# Patient Record
Sex: Female | Born: 1990 | Race: White | Hispanic: No | Marital: Married | Smoking: Former smoker
Health system: Southern US, Community
[De-identification: ages and names within clinical notes are randomized; demographics above are authoritative.]

## PROBLEM LIST (undated history)

## (undated) DIAGNOSIS — O139 Gestational [pregnancy-induced] hypertension without significant proteinuria, unspecified trimester: Secondary | ICD-10-CM

## (undated) DIAGNOSIS — D649 Anemia, unspecified: Secondary | ICD-10-CM

## (undated) DIAGNOSIS — J45909 Unspecified asthma, uncomplicated: Secondary | ICD-10-CM

## (undated) HISTORY — DX: Anemia, unspecified: D64.9

## (undated) HISTORY — PX: NO PAST SURGERIES: SHX2092

## (undated) HISTORY — DX: Gestational (pregnancy-induced) hypertension without significant proteinuria, unspecified trimester: O13.9

---
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
## 2011-04-22 ENCOUNTER — Other Ambulatory Visit: Payer: Self-pay | Admitting: Family Medicine

## 2011-04-22 ENCOUNTER — Ambulatory Visit
Admission: RE | Admit: 2011-04-22 | Discharge: 2011-04-22 | Disposition: A | Discharge status: Home / Self Care | Payer: 59 | Source: Ambulatory Visit | Attending: Family Medicine | Admitting: Family Medicine

## 2011-04-22 DIAGNOSIS — R1011 Right upper quadrant pain: Secondary | ICD-10-CM

## 2013-03-08 ENCOUNTER — Emergency Department (HOSPITAL_COMMUNITY)
Admission: EM | Admit: 2013-03-08 | Discharge: 2013-03-08 | Disposition: A | Discharge status: Home / Self Care | Payer: 59 | Attending: Emergency Medicine | Admitting: Emergency Medicine

## 2013-03-08 ENCOUNTER — Encounter (HOSPITAL_COMMUNITY): Payer: Self-pay | Admitting: *Deleted

## 2013-03-08 ENCOUNTER — Emergency Department (HOSPITAL_COMMUNITY): Payer: 59

## 2013-03-08 DIAGNOSIS — K219 Gastro-esophageal reflux disease without esophagitis: Secondary | ICD-10-CM | POA: Insufficient documentation

## 2013-03-08 DIAGNOSIS — R110 Nausea: Secondary | ICD-10-CM | POA: Insufficient documentation

## 2013-03-08 DIAGNOSIS — Z3202 Encounter for pregnancy test, result negative: Secondary | ICD-10-CM | POA: Insufficient documentation

## 2013-03-08 LAB — CBC WITH DIFFERENTIAL/PLATELET
Basophils Relative: 0 % (ref 0–1)
HCT: 37.7 % (ref 36.0–46.0)
Hemoglobin: 13.7 g/dL (ref 12.0–15.0)
Lymphocytes Relative: 44 % (ref 12–46)
Lymphs Abs: 4.1 10*3/uL — ABNORMAL HIGH (ref 0.7–4.0)
Monocytes Absolute: 0.7 10*3/uL (ref 0.1–1.0)
Monocytes Relative: 7 % (ref 3–12)
Neutro Abs: 4.3 10*3/uL (ref 1.7–7.7)
Neutrophils Relative %: 47 % (ref 43–77)
RBC: 4.26 MIL/uL (ref 3.87–5.11)
WBC: 9.3 10*3/uL (ref 4.0–10.5)

## 2013-03-08 LAB — POCT I-STAT, CHEM 8
BUN: 17 mg/dL (ref 6–23)
Creatinine, Ser: 1.1 mg/dL (ref 0.50–1.10)
Potassium: 4 mEq/L (ref 3.5–5.1)
Sodium: 138 mEq/L (ref 135–145)
TCO2: 24 mmol/L (ref 0–100)

## 2013-03-08 LAB — HEPATIC FUNCTION PANEL
ALT: 8 U/L (ref 0–35)
AST: 15 U/L (ref 0–37)
Albumin: 3.7 g/dL (ref 3.5–5.2)
Alkaline Phosphatase: 55 U/L (ref 39–117)
Total Bilirubin: 0.2 mg/dL — ABNORMAL LOW (ref 0.3–1.2)

## 2013-03-08 LAB — URINALYSIS, ROUTINE W REFLEX MICROSCOPIC
Glucose, UA: NEGATIVE mg/dL
Hgb urine dipstick: NEGATIVE
Ketones, ur: NEGATIVE mg/dL
pH: 6 (ref 5.0–8.0)

## 2013-03-08 LAB — URINE MICROSCOPIC-ADD ON

## 2013-03-08 MED ORDER — OMEPRAZOLE 20 MG PO CPDR: 40.0000 mg | DELAYED_RELEASE_CAPSULE | Freq: Every day | ORAL | Status: DC

## 2013-03-08 MED ORDER — GI COCKTAIL ~~LOC~~
30.0000 mL | Freq: Once | ORAL | Status: AC
  Administered 2013-03-08: 30 mL via ORAL
  Filled 2013-03-08: qty 30

## 2013-03-08 MED ORDER — ONDANSETRON HCL 4 MG/2ML IJ SOLN
4.0000 mg | Freq: Once | INTRAMUSCULAR | Status: AC
  Administered 2013-03-08: 4 mg via INTRAVENOUS
  Filled 2013-03-08: qty 2

## 2013-03-08 MED ORDER — MORPHINE SULFATE 4 MG/ML IJ SOLN
4.0000 mg | Freq: Once | INTRAMUSCULAR | Status: AC
  Administered 2013-03-08: 4 mg via INTRAVENOUS
  Filled 2013-03-08: qty 1

## 2013-03-08 MED ORDER — SUCRALFATE 1 GM/10ML PO SUSP: 1.0000 g | Freq: Four times a day (QID) | ORAL | Status: DC

## 2013-03-08 NOTE — ED Notes (Signed)
Pt c/o abd pain x 2 weeks; pt states the pain is to upper abd radiating to the left side; pt states pain is worse after eating and at night; pt states that she has vomited at times

## 2013-03-08 NOTE — ED Provider Notes (Signed)
CSN: [id number]     Arrival date & time 03/08/13  0020 History   First MD Initiated Contact with Patient 03/08/13 0032     Chief Complaint  Patient presents with  . Abdominal Pain   (Consider location/radiation/quality/duration/timing/severity/associated sxs/prior Treatment) Patient is a 22 y.o. female presenting with abdominal pain. The history is provided by the patient. No language interpreter was used.  Abdominal Pain Pain location:  LUQ and epigastric Pain quality: aching   Pain radiates to:  Does not radiate Pain severity:  Severe Onset quality:  Gradual Duration:  2 weeks Timing:  Constant Progression:  Unchanged Chronicity:  New Context: eating   Context: not previous surgeries   Relieved by:  Nothing Worsened by:  Nothing tried Ineffective treatments:  None tried Associated symptoms: nausea   Associated symptoms: no chest pain, no fever and no shortness of breath   Risk factors: not pregnant   Also worse with nausea in the evenings when laying down  History reviewed. No pertinent past medical history. History reviewed. No pertinent past surgical history. No family history on file. History  Substance Use Topics  . Smoking status: Never Smoker   . Smokeless tobacco: Not on file  . Alcohol Use: No   OB History   Grav Para Term Preterm Abortions TAB SAB Ect Mult Living                 Review of Systems  Constitutional: Negative for fever.  Respiratory: Negative for shortness of breath.   Cardiovascular: Negative for chest pain.  Gastrointestinal: Positive for nausea and abdominal pain.  All other systems reviewed and are negative.    Allergies  Review of patient's allergies indicates no known allergies.  Home Medications  No current outpatient prescriptions on file. BP 140/69  Pulse 92  Temp(Src) 98.9 F (37.2 C)  Resp 18  Ht 5\' 5"  (1.651 m)  Wt 183 lb (83.008 kg)  BMI 30.45 kg/m2  SpO2 99%  LMP 02/08/2013 Physical Exam  Constitutional: She is  oriented to person, place, and time. She appears well-developed and well-nourished. No distress.  HENT:  Head: Normocephalic and atraumatic.  Mouth/Throat: Oropharynx is clear and moist.  Eyes: Conjunctivae are normal. Pupils are equal, round, and reactive to light.  Neck: Normal range of motion. Neck supple.  Cardiovascular: Normal rate, regular rhythm and intact distal pulses.   Pulmonary/Chest: Effort normal and breath sounds normal. She has no wheezes. She has no rales.  Abdominal: Soft. Bowel sounds are normal. There is tenderness in the left upper quadrant. There is no rebound and no guarding.  Musculoskeletal: Normal range of motion.  Neurological: She is alert and oriented to person, place, and time.  Skin: Skin is warm and dry.  Psychiatric: She has a normal mood and affect.    ED Course  Procedures (including critical care time) Labs Review Labs Reviewed  CBC WITH DIFFERENTIAL  HEPATIC FUNCTION PANEL  LIPASE, BLOOD  URINALYSIS, ROUTINE W REFLEX MICROSCOPIC   Imaging Review No results found.  MDM  No diagnosis found. Exam and labs consistent with GERD.  No indication for advanced imaging.  Do not feel this is biliary colic, after 2 weeks LFT should be elevated.  Will refer to PMD    Andraya Frigon K Jaece Ducharme-Rasch, MD 03/08/13 [phone]

## 2013-03-09 LAB — URINE CULTURE

## 2013-04-01 NOTE — ED Notes (Signed)
Pt states that she has had diarrhea, nausea and a fever. Pt states that she has not been out of the country. Pt denies vomiting. Pt gave plasma wed and is worried that she may have had these symtpoms from that.

## 2013-04-02 ENCOUNTER — Encounter (HOSPITAL_COMMUNITY): Payer: Self-pay | Admitting: Emergency Medicine

## 2013-04-02 ENCOUNTER — Emergency Department (HOSPITAL_COMMUNITY)
Admission: EM | Admit: 2013-04-02 | Discharge: 2013-04-02 | Disposition: A | Discharge status: Home / Self Care | Payer: 59

## 2013-04-02 NOTE — ED Notes (Signed)
Per registration, wrong patient registered and triaged.

## 2016-08-07 DIAGNOSIS — Z01419 Encounter for gynecological examination (general) (routine) without abnormal findings: Secondary | ICD-10-CM | POA: Diagnosis not present

## 2016-08-07 DIAGNOSIS — Z6832 Body mass index (BMI) 32.0-32.9, adult: Secondary | ICD-10-CM | POA: Diagnosis not present

## 2016-08-28 DIAGNOSIS — Z30430 Encounter for insertion of intrauterine contraceptive device: Secondary | ICD-10-CM | POA: Diagnosis not present

## 2016-09-16 DIAGNOSIS — R1310 Dysphagia, unspecified: Secondary | ICD-10-CM | POA: Diagnosis not present

## 2016-09-16 DIAGNOSIS — R1013 Epigastric pain: Secondary | ICD-10-CM | POA: Diagnosis not present

## 2016-09-16 DIAGNOSIS — R112 Nausea with vomiting, unspecified: Secondary | ICD-10-CM | POA: Diagnosis not present

## 2016-09-16 DIAGNOSIS — K219 Gastro-esophageal reflux disease without esophagitis: Secondary | ICD-10-CM | POA: Diagnosis not present

## 2016-10-09 DIAGNOSIS — Z30431 Encounter for routine checking of intrauterine contraceptive device: Secondary | ICD-10-CM | POA: Diagnosis not present

## 2016-10-14 DIAGNOSIS — K219 Gastro-esophageal reflux disease without esophagitis: Secondary | ICD-10-CM | POA: Diagnosis not present

## 2016-10-14 DIAGNOSIS — R1310 Dysphagia, unspecified: Secondary | ICD-10-CM | POA: Diagnosis not present

## 2016-10-14 DIAGNOSIS — R112 Nausea with vomiting, unspecified: Secondary | ICD-10-CM | POA: Diagnosis not present

## 2016-12-06 DIAGNOSIS — F4321 Adjustment disorder with depressed mood: Secondary | ICD-10-CM | POA: Diagnosis not present

## 2016-12-12 DIAGNOSIS — F4321 Adjustment disorder with depressed mood: Secondary | ICD-10-CM | POA: Diagnosis not present

## 2016-12-19 DIAGNOSIS — F4321 Adjustment disorder with depressed mood: Secondary | ICD-10-CM | POA: Diagnosis not present

## 2016-12-26 DIAGNOSIS — F4321 Adjustment disorder with depressed mood: Secondary | ICD-10-CM | POA: Diagnosis not present

## 2017-01-02 DIAGNOSIS — F4321 Adjustment disorder with depressed mood: Secondary | ICD-10-CM | POA: Diagnosis not present

## 2017-01-09 DIAGNOSIS — F4321 Adjustment disorder with depressed mood: Secondary | ICD-10-CM | POA: Diagnosis not present

## 2017-01-23 DIAGNOSIS — F4321 Adjustment disorder with depressed mood: Secondary | ICD-10-CM | POA: Diagnosis not present

## 2017-01-30 DIAGNOSIS — F4321 Adjustment disorder with depressed mood: Secondary | ICD-10-CM | POA: Diagnosis not present

## 2017-02-06 DIAGNOSIS — F4321 Adjustment disorder with depressed mood: Secondary | ICD-10-CM | POA: Diagnosis not present

## 2017-02-13 DIAGNOSIS — F4321 Adjustment disorder with depressed mood: Secondary | ICD-10-CM | POA: Diagnosis not present

## 2017-03-19 DIAGNOSIS — R05 Cough: Secondary | ICD-10-CM | POA: Diagnosis not present

## 2017-03-19 DIAGNOSIS — J209 Acute bronchitis, unspecified: Secondary | ICD-10-CM | POA: Diagnosis not present

## 2017-08-27 DIAGNOSIS — J101 Influenza due to other identified influenza virus with other respiratory manifestations: Secondary | ICD-10-CM | POA: Diagnosis not present

## 2017-09-02 DIAGNOSIS — B9689 Other specified bacterial agents as the cause of diseases classified elsewhere: Secondary | ICD-10-CM | POA: Diagnosis not present

## 2017-09-02 DIAGNOSIS — J208 Acute bronchitis due to other specified organisms: Secondary | ICD-10-CM | POA: Diagnosis not present

## 2017-11-03 DIAGNOSIS — Z01419 Encounter for gynecological examination (general) (routine) without abnormal findings: Secondary | ICD-10-CM | POA: Diagnosis not present

## 2017-11-03 DIAGNOSIS — Z30432 Encounter for removal of intrauterine contraceptive device: Secondary | ICD-10-CM | POA: Diagnosis not present

## 2017-11-03 DIAGNOSIS — Z6836 Body mass index (BMI) 36.0-36.9, adult: Secondary | ICD-10-CM | POA: Diagnosis not present

## 2018-04-08 DIAGNOSIS — F411 Generalized anxiety disorder: Secondary | ICD-10-CM | POA: Diagnosis not present

## 2018-04-08 DIAGNOSIS — R454 Irritability and anger: Secondary | ICD-10-CM | POA: Diagnosis not present

## 2018-05-03 ENCOUNTER — Emergency Department (HOSPITAL_COMMUNITY): Payer: Commercial Managed Care - PPO

## 2018-05-03 ENCOUNTER — Other Ambulatory Visit: Payer: Self-pay

## 2018-05-03 ENCOUNTER — Emergency Department (HOSPITAL_COMMUNITY)
Admission: EM | Admit: 2018-05-03 | Discharge: 2018-05-03 | Disposition: A | Discharge status: Home / Self Care | Payer: Commercial Managed Care - PPO | Attending: Emergency Medicine | Admitting: Emergency Medicine

## 2018-05-03 ENCOUNTER — Encounter (HOSPITAL_COMMUNITY): Payer: Self-pay

## 2018-05-03 DIAGNOSIS — J9801 Acute bronchospasm: Secondary | ICD-10-CM | POA: Diagnosis not present

## 2018-05-03 DIAGNOSIS — Z79899 Other long term (current) drug therapy: Secondary | ICD-10-CM | POA: Insufficient documentation

## 2018-05-03 DIAGNOSIS — F29 Unspecified psychosis not due to a substance or known physiological condition: Secondary | ICD-10-CM | POA: Diagnosis not present

## 2018-05-03 DIAGNOSIS — R000 Tachycardia, unspecified: Secondary | ICD-10-CM | POA: Diagnosis not present

## 2018-05-03 DIAGNOSIS — R0902 Hypoxemia: Secondary | ICD-10-CM | POA: Diagnosis not present

## 2018-05-03 DIAGNOSIS — R0602 Shortness of breath: Secondary | ICD-10-CM | POA: Insufficient documentation

## 2018-05-03 DIAGNOSIS — R0689 Other abnormalities of breathing: Secondary | ICD-10-CM | POA: Diagnosis not present

## 2018-05-03 DIAGNOSIS — F410 Panic disorder [episodic paroxysmal anxiety] without agoraphobia: Secondary | ICD-10-CM | POA: Diagnosis present

## 2018-05-03 DIAGNOSIS — R064 Hyperventilation: Secondary | ICD-10-CM | POA: Diagnosis not present

## 2018-05-03 LAB — I-STAT BETA HCG BLOOD, ED (MC, WL, AP ONLY)

## 2018-05-03 LAB — CBC WITH DIFFERENTIAL/PLATELET
Abs Immature Granulocytes: 0.05 10*3/uL (ref 0.00–0.07)
BASOS ABS: 0 10*3/uL (ref 0.0–0.1)
BASOS PCT: 0 %
EOS ABS: 0 10*3/uL (ref 0.0–0.5)
EOS PCT: 0 %
HCT: 42.9 % (ref 36.0–46.0)
Hemoglobin: 14.9 g/dL (ref 12.0–15.0)
IMMATURE GRANULOCYTES: 1 %
Lymphocytes Relative: 18 %
Lymphs Abs: 2 10*3/uL (ref 0.7–4.0)
MCH: 31.5 pg (ref 26.0–34.0)
MCHC: 34.7 g/dL (ref 30.0–36.0)
MCV: 90.7 fL (ref 80.0–100.0)
Monocytes Absolute: 1 10*3/uL (ref 0.1–1.0)
Monocytes Relative: 9 %
NEUTROS PCT: 72 %
NRBC: 0 % (ref 0.0–0.2)
Neutro Abs: 7.8 10*3/uL — ABNORMAL HIGH (ref 1.7–7.7)
PLATELETS: 291 10*3/uL (ref 150–400)
RBC: 4.73 MIL/uL (ref 3.87–5.11)
RDW: 12.5 % (ref 11.5–15.5)
WBC: 10.9 10*3/uL — AB (ref 4.0–10.5)

## 2018-05-03 LAB — RAPID URINE DRUG SCREEN, HOSP PERFORMED
Amphetamines: NOT DETECTED
BARBITURATES: NOT DETECTED
Benzodiazepines: POSITIVE — AB
Cocaine: NOT DETECTED
Opiates: NOT DETECTED
Tetrahydrocannabinol: POSITIVE — AB

## 2018-05-03 LAB — BLOOD GAS, ARTERIAL
Acid-base deficit: 1.7 mmol/L (ref 0.0–2.0)
Bicarbonate: 18.6 mmol/L — ABNORMAL LOW (ref 20.0–28.0)
Drawn by: 51425
FIO2: 21
O2 Saturation: 97.2 %
PH ART: 7.548 — AB (ref 7.350–7.450)
PO2 ART: 85.3 mmHg (ref 83.0–108.0)
Patient temperature: 98.5
pCO2 arterial: 21.4 mmHg — ABNORMAL LOW (ref 32.0–48.0)

## 2018-05-03 LAB — COMPREHENSIVE METABOLIC PANEL
ALBUMIN: 5 g/dL (ref 3.5–5.0)
ALT: 14 U/L (ref 0–44)
ANION GAP: 12 (ref 5–15)
AST: 21 U/L (ref 15–41)
Alkaline Phosphatase: 63 U/L (ref 38–126)
BUN: 11 mg/dL (ref 6–20)
CHLORIDE: 110 mmol/L (ref 98–111)
CO2: 19 mmol/L — ABNORMAL LOW (ref 22–32)
Calcium: 10.1 mg/dL (ref 8.9–10.3)
Creatinine, Ser: 0.95 mg/dL (ref 0.44–1.00)
GFR calc Af Amer: >60 mL/min (ref >60)
GFR calc non Af Amer: >60 mL/min (ref >60)
GLUCOSE: 99 mg/dL (ref 70–99)
POTASSIUM: 3.3 mmol/L — AB (ref 3.5–5.1)
SODIUM: 141 mmol/L (ref 135–145)
Total Bilirubin: 0.9 mg/dL (ref 0.3–1.2)
Total Protein: 7.9 g/dL (ref 6.5–8.1)

## 2018-05-03 LAB — ACETAMINOPHEN LEVEL

## 2018-05-03 LAB — SALICYLATE LEVEL

## 2018-05-03 LAB — ETHANOL

## 2018-05-03 MED ORDER — ALBUTEROL SULFATE HFA 108 (90 BASE) MCG/ACT IN AERS
2.0000 | INHALATION_SPRAY | RESPIRATORY_TRACT | Status: DC
  Administered 2018-05-03: 2 via RESPIRATORY_TRACT
  Filled 2018-05-03: qty 6.7

## 2018-05-03 MED ORDER — METHYLPREDNISOLONE SODIUM SUCC 125 MG IJ SOLR: 125.0000 mg | Freq: Once | INTRAMUSCULAR | Status: DC

## 2018-05-03 MED ORDER — IOPAMIDOL (ISOVUE-370) INJECTION 76%
INTRAVENOUS | Status: AC
  Filled 2018-05-03: qty 100

## 2018-05-03 MED ORDER — SODIUM CHLORIDE 0.9 % IV SOLN
INTRAVENOUS | Status: DC
  Administered 2018-05-03: 1000 mL via INTRAVENOUS

## 2018-05-03 MED ORDER — IOPAMIDOL (ISOVUE-370) INJECTION 76%
100.0000 mL | Freq: Once | INTRAVENOUS | Status: AC | PRN
  Administered 2018-05-03: 100 mL via INTRAVENOUS

## 2018-05-03 MED ORDER — PREDNISONE 50 MG PO TABS: ORAL_TABLET | ORAL | 0 refills | Status: DC

## 2018-05-03 MED ORDER — SODIUM CHLORIDE (PF) 0.9 % IJ SOLN
INTRAMUSCULAR | Status: AC
  Filled 2018-05-03: qty 50

## 2018-05-03 MED ORDER — IPRATROPIUM BROMIDE 0.02 % IN SOLN
0.5000 mg | Freq: Once | RESPIRATORY_TRACT | Status: AC
  Administered 2018-05-03: 0.5 mg via RESPIRATORY_TRACT
  Filled 2018-05-03: qty 2.5

## 2018-05-03 MED ORDER — ALBUTEROL SULFATE (2.5 MG/3ML) 0.083% IN NEBU
5.0000 mg | INHALATION_SOLUTION | Freq: Once | RESPIRATORY_TRACT | Status: AC
  Administered 2018-05-03: 5 mg via RESPIRATORY_TRACT
  Filled 2018-05-03: qty 6

## 2018-05-03 NOTE — ED Triage Notes (Signed)
EMS called out because patient was having some congestion and began hyperventilating. Family attempted to calm her down for 45 minutes and was unable, patient breathing 80-100 times a minute. EMS unable to coach breathing patient given a total of 10 midazolam IM. Patient calmed down for period of time but began hyperventilating again on arrival. Patient has no history of panic disorder but does have history of depression and recently started Sertraline

## 2018-05-03 NOTE — ED Notes (Signed)
Patient respirations now 21, appears calmer

## 2018-05-03 NOTE — ED Provider Notes (Signed)
[facility] DEPT Provider Note   CSN: [id number] Arrival date & time: 05/03/18  1935     History   Chief Complaint Chief Complaint  Patient presents with  . Panic Attack    HPI Misty [name] is a 27 y.o. female.  27 year old female presents with hyperventilation.  Patient came in from today and had been complaining of cold symptoms.  EMS was called and patient was hyperventilating and she was treated with midazolam 5 mg x 2.  Patient had calm down but then once again began to hyperventilate.  According to foster mother, patient has been recently treated for depression.  Denies any concern for suicidal ideations.  No further history obtainable due to her current state     History reviewed. No pertinent past medical history.  There are no active problems to display for this patient.   History reviewed. No pertinent surgical history.   OB History   None      Home Medications    Prior to Admission medications   Medication Sig Start Date End Date Taking? Authorizing Provider  acetaminophen (TYLENOL) 500 MG tablet Take 2,000 mg by mouth every 6 (six) hours as needed for pain (pain).    [provider]  ibuprofen (ADVIL,MOTRIN) 200 MG tablet Take 800 mg by mouth every 6 (six) hours as needed for pain (pain).    [provider]  omeprazole (PRILOSEC) 20 MG capsule Take 2 capsules (40 mg total) by mouth daily. 03/08/13   Palumbo, April, MD  ondansetron (ZOFRAN-ODT) 8 MG disintegrating tablet Take 8 mg by mouth every 8 (eight) hours as needed for nausea (nausea).    [provider]  phentermine 37.5 MG capsule Take 37.5 mg by mouth daily.    [provider]  sertraline (ZOLOFT) 50 MG tablet Take 75 mg by mouth daily.    [provider]  sucralfate (CARAFATE) 1 GM/10ML suspension Take 10 mLs (1 g total) by mouth 4 (four) times daily. 03/08/13   Palumbo, April, MD    Family History History reviewed. No  pertinent family history.  Social History Social History   Tobacco Use  . Smoking status: Never Smoker  Substance Use Topics  . Alcohol use: No  . Drug use: No     Allergies   Bactrim [sulfamethoxazole-trimethoprim]   Review of Systems Review of Systems  Unable to perform ROS: Acuity of condition     Physical Exam Updated Vital Signs BP (!) 154/103 (BP Location: Left Arm)   Pulse (!) 107   Temp 98.5 F (36.9 C) (Oral)   Resp (!) 34 Comment: periods of hyperventilating, then she holds her breath periodically  SpO2 100%   Physical Exam  Constitutional: She appears well-developed and well-nourished. She appears lethargic.  Non-toxic appearance. No distress.  HENT:  Head: Normocephalic and atraumatic.  Eyes: Pupils are equal, round, and reactive to light. Conjunctivae, EOM and lids are normal.  Neck: Normal range of motion. Neck supple. No tracheal deviation present. No thyroid mass present.  Cardiovascular: Normal rate, regular rhythm and normal heart sounds. Exam reveals no gallop.  No murmur heard. Pulmonary/Chest: Effort normal and breath sounds normal. No stridor. No respiratory distress. She has no decreased breath sounds. She has no wheezes. She has no rhonchi. She has no rales.  Abdominal: Soft. Normal appearance and bowel sounds are normal. She exhibits no distension. There is no tenderness. There is no rebound and no CVA tenderness.  Musculoskeletal: Normal range of motion. She exhibits  no edema or tenderness.  Neurological: She appears lethargic. GCS eye subscore is 3. GCS verbal subscore is 4. GCS motor subscore is 5.  Uncooperative with exam.  Withdraws to pain in all 4 extremities.  Skin: Skin is warm and dry. No abrasion and no rash noted.  Psychiatric: Her affect is labile. She is withdrawn. She is inattentive.  Nursing note and vitals reviewed.    ED Treatments / Results  Labs (all labs ordered are listed, but only abnormal results are  displayed) Labs Reviewed - No data to display  EKG None  Radiology No results found.  Procedures Procedures (including critical care time)  Medications Ordered in ED Medications  0.9 %  sodium chloride infusion (has no administration in time range)     Initial Impression / Assessment and Plan / ED Course  I have reviewed the triage vital signs and the nursing notes.  Pertinent labs & imaging results that were available during my care of the patient were reviewed by me and considered in my medical decision making (see chart for details).     Patient very anxious here and was difficult to speak with.  Her foster mother states that patient has had a cold recently.  On exam here patient has baby increased expiratory phase.  Chest x-ray did not show any signs pneumothorax.  Soft tissue neck also negative.  Patient grabs became somewhat better.  She was given albuterol which improved her symptoms.  Because of her severe dyspnea she underwent a CT of the chest which was negative.  Patient is now at baseline.  Suspect that she had some bronchospasm and she will be discharged home.  Final Clinical Impressions(s) / ED Diagnoses   Final diagnoses:  SOB (shortness of breath)    ED Discharge Orders    None       Lacretia Leigh, MD 05/03/18 2326

## 2018-05-03 NOTE — ED Notes (Signed)
Patient transported to CT 

## 2018-05-03 NOTE — ED Notes (Signed)
Bed: IO27 Expected date:  Expected time:  Means of arrival:  Comments: 27 yo F/ Panic attack

## 2018-05-03 NOTE — ED Notes (Signed)
Patient hyperventilating with respirations of 42 bpm. Patient will hold breath for approximately 10 seconds then begin hyperventilating again. Patient able to speak some words in between breaths.

## 2018-05-05 DIAGNOSIS — E876 Hypokalemia: Secondary | ICD-10-CM | POA: Diagnosis not present

## 2018-05-05 DIAGNOSIS — F39 Unspecified mood [affective] disorder: Secondary | ICD-10-CM | POA: Diagnosis not present

## 2018-05-05 DIAGNOSIS — J209 Acute bronchitis, unspecified: Secondary | ICD-10-CM | POA: Diagnosis not present

## 2018-05-06 ENCOUNTER — Emergency Department (HOSPITAL_COMMUNITY)
Admission: EM | Admit: 2018-05-06 | Discharge: 2018-05-06 | Disposition: A | Discharge status: Home / Self Care | Payer: Commercial Managed Care - PPO | Attending: Emergency Medicine | Admitting: Emergency Medicine

## 2018-05-06 ENCOUNTER — Emergency Department (HOSPITAL_COMMUNITY): Payer: Commercial Managed Care - PPO

## 2018-05-06 ENCOUNTER — Other Ambulatory Visit: Payer: Self-pay

## 2018-05-06 DIAGNOSIS — Z79899 Other long term (current) drug therapy: Secondary | ICD-10-CM | POA: Diagnosis not present

## 2018-05-06 DIAGNOSIS — R0602 Shortness of breath: Secondary | ICD-10-CM | POA: Diagnosis not present

## 2018-05-06 DIAGNOSIS — R0689 Other abnormalities of breathing: Secondary | ICD-10-CM | POA: Diagnosis not present

## 2018-05-06 DIAGNOSIS — I10 Essential (primary) hypertension: Secondary | ICD-10-CM | POA: Diagnosis not present

## 2018-05-06 DIAGNOSIS — R064 Hyperventilation: Secondary | ICD-10-CM | POA: Diagnosis not present

## 2018-05-06 LAB — COMPREHENSIVE METABOLIC PANEL
ALBUMIN: 4.5 g/dL (ref 3.5–5.0)
ALT: 12 U/L (ref 0–44)
AST: 18 U/L (ref 15–41)
Alkaline Phosphatase: 56 U/L (ref 38–126)
Anion gap: 13 (ref 5–15)
BILIRUBIN TOTAL: 0.9 mg/dL (ref 0.3–1.2)
BUN: 11 mg/dL (ref 6–20)
CO2: 18 mmol/L — AB (ref 22–32)
CREATININE: 1.03 mg/dL — AB (ref 0.44–1.00)
Calcium: 9.6 mg/dL (ref 8.9–10.3)
Chloride: 109 mmol/L (ref 98–111)
GFR calc Af Amer: >60 mL/min (ref >60)
GFR calc non Af Amer: >60 mL/min (ref >60)
GLUCOSE: 94 mg/dL (ref 70–99)
Potassium: 3.2 mmol/L — ABNORMAL LOW (ref 3.5–5.1)
SODIUM: 140 mmol/L (ref 135–145)
TOTAL PROTEIN: 7.5 g/dL (ref 6.5–8.1)

## 2018-05-06 LAB — CBC WITH DIFFERENTIAL/PLATELET
ABS IMMATURE GRANULOCYTES: 0.03 10*3/uL (ref 0.00–0.07)
BASOS ABS: 0 10*3/uL (ref 0.0–0.1)
Basophils Relative: 0 %
Eosinophils Absolute: 0.1 10*3/uL (ref 0.0–0.5)
Eosinophils Relative: 1 %
HEMATOCRIT: 42.6 % (ref 36.0–46.0)
HEMOGLOBIN: 14.5 g/dL (ref 12.0–15.0)
Immature Granulocytes: 0 %
LYMPHS ABS: 3.7 10*3/uL (ref 0.7–4.0)
Lymphocytes Relative: 40 %
MCH: 30.6 pg (ref 26.0–34.0)
MCHC: 34 g/dL (ref 30.0–36.0)
MCV: 89.9 fL (ref 80.0–100.0)
Monocytes Absolute: 0.6 10*3/uL (ref 0.1–1.0)
Monocytes Relative: 6 %
NEUTROS ABS: 4.9 10*3/uL (ref 1.7–7.7)
NRBC: 0 % (ref 0.0–0.2)
Neutrophils Relative %: 53 %
Platelets: 315 10*3/uL (ref 150–400)
RBC: 4.74 MIL/uL (ref 3.87–5.11)
RDW: 12.8 % (ref 11.5–15.5)
WBC: 9.4 10*3/uL (ref 4.0–10.5)

## 2018-05-06 LAB — I-STAT BETA HCG BLOOD, ED (MC, WL, AP ONLY): I-stat hCG, quantitative: 16.4 m[IU]/mL — ABNORMAL HIGH (ref <5)

## 2018-05-06 LAB — MAGNESIUM: Magnesium: 2.1 mg/dL (ref 1.7–2.4)

## 2018-05-06 LAB — HCG, QUANTITATIVE, PREGNANCY: hCG, Beta Chain, Quant, S: <1 m[IU]/mL (ref <5)

## 2018-05-06 MED ORDER — LORAZEPAM 1 MG PO TABS
1.0000 mg | ORAL_TABLET | Freq: Once | ORAL | Status: AC
  Administered 2018-05-06: 1 mg via ORAL
  Filled 2018-05-06: qty 1

## 2018-05-06 MED ORDER — HYDROXYZINE HCL 25 MG PO TABS: 25.0000 mg | ORAL_TABLET | Freq: Three times a day (TID) | ORAL | 0 refills | Status: DC | PRN

## 2018-05-06 NOTE — ED Provider Notes (Signed)
[facility] EMERGENCY DEPARTMENT Provider Note   CSN: [id number] Arrival date & time: 05/06/18  1542     History   Chief Complaint Chief Complaint  Patient presents with  . Panic Attack    HPI Misty [name] is a 27 y.o. female and in for evaluation of shortness of breath.  Patient states she has been having episodes of shortness of breath for the past 4 days.  Today, she woke up coughing, and has not been able to catch her breath since.  She denies fevers, chills, chest pain, nausea, vomiting, abdominal pain, urinary symptoms, normal bowel movements.  Patient states she was recently diagnosed with URI when in the ER 3 days ago.  She was started on prednisone, which he has been taking.  Patient's only other new medication is Zoloft, she started 4 weeks ago.  Patient denies a history of anxiety prior to these episodes.  Patient states that after using her inhaler today, she felt more anxious.  She has not tried anything else for her symptoms.  Patient states she has no medical problems, takes no other medications daily.  Additional history obtained from chart review.  Patient has full work-up 3 days ago at [facility] long emergency room, including CT scan to rule out PE.   HPI  No past medical history on file.  There are no active problems to display for this patient.   No past surgical history on file.   OB History   None      Home Medications    Prior to Admission medications   Medication Sig Start Date End Date Taking? Authorizing Provider  norethindrone-ethinyl estradiol (JUNEL FE,GILDESS FE,LOESTRIN FE) 1-20 MG-MCG tablet Take 1 tablet by mouth daily.   Yes [provider]  predniSONE (DELTASONE) 50 MG tablet 1 p.o. daily x4 05/03/18  Yes Lacretia Leigh, MD  sertraline (ZOLOFT) 50 MG tablet Take 50 mg by mouth daily.    Yes [provider]  hydrOXYzine (ATARAX/VISTARIL) 25 MG tablet Take 1 tablet (25 mg total) by mouth every 8  (eight) hours as needed for anxiety. 05/06/18   Asianna Brundage, PA-C  omeprazole (PRILOSEC) 20 MG capsule Take 2 capsules (40 mg total) by mouth daily. Patient not taking: Reported on 05/03/2018 03/08/13   Randal Buba, April, MD    Family History No family history on file.  Social History Social History   Tobacco Use  . Smoking status: Never Smoker  Substance Use Topics  . Alcohol use: No  . Drug use: No     Allergies   Diclofenac and Bactrim [sulfamethoxazole-trimethoprim]   Review of Systems Review of Systems  Respiratory: Positive for chest tightness and shortness of breath.   Psychiatric/Behavioral: The patient is nervous/anxious.   All other systems reviewed and are negative.    Physical Exam Updated Vital Signs BP 123/72   Pulse 90   Temp 98.7 F (37.1 C) (Oral)   Resp (!) 21   LMP  (LMP Unknown) Comment: Pt shielded.   Neg pregnancy test on 05/03/2018.  SpO2 99%   Physical Exam  Constitutional: She is oriented to person, place, and time. She appears well-developed and well-nourished.  Pt is very anxious and tachypneic  HENT:  Head: Normocephalic and atraumatic.  MM moist  Eyes: Pupils are equal, round, and reactive to light. Conjunctivae and EOM are normal.  Neck: Normal range of motion. Neck supple.  Cardiovascular: Normal rate, regular rhythm and intact distal pulses.  Pulmonary/Chest: Effort normal and breath sounds  normal. Tachypnea noted. She has no wheezes. She has no rhonchi. She has no rales.  Tachypnea resolves when pt responds to questions.  Clear lung sounds in all fields.  Speaking in full sentences. tachypneic to 40  Abdominal: Soft. She exhibits no distension. There is no tenderness.  Musculoskeletal: Normal range of motion. She exhibits no edema.  No leg pain or swelling  Neurological: She is alert and oriented to person, place, and time.  Skin: Skin is warm and dry. Capillary refill takes less than 2 seconds.  Psychiatric: Her mood appears  anxious.  Patient appears very anxious.   Nursing note and vitals reviewed.    ED Treatments / Results  Labs (all labs ordered are listed, but only abnormal results are displayed) Labs Reviewed  COMPREHENSIVE METABOLIC PANEL - Abnormal; Notable for the following components:      Result Value   Potassium 3.2 (*)    CO2 18 (*)    Creatinine, Ser 1.03 (*)    All other components within normal limits  I-STAT BETA HCG BLOOD, ED ([facility], [facility], [facility] ONLY) - Abnormal; Notable for the following components:   I-stat hCG, quantitative 16.4 (*)    All other components within normal limits  CBC WITH DIFFERENTIAL/PLATELET  MAGNESIUM  HCG, QUANTITATIVE, PREGNANCY    EKG EKG Interpretation  Date/Time:  Wednesday May 06 2018 15:49:10 EST Ventricular Rate:  95 PR Interval:    QRS Duration: 96 QT Interval:  364 QTC Calculation: 458 R Axis:   65 Text Interpretation:  Sinus rhythm Low voltage, precordial leads Borderline T abnormalities, anterior leads No significant change since last tracing Confirmed by Deno Etienne [phone]) on 05/06/2018 3:56:39 PM   Radiology Dg Chest 2 View  Result Date: 05/06/2018 CLINICAL DATA:  27 year old female with shortness of breath. EXAM: CHEST - 2 VIEW COMPARISON:  Chest CTA 05/03/2018 and earlier. FINDINGS: Upright AP and lateral views. The heart size and mediastinal contours are within normal limits. Both lungs are clear. No pneumothorax or pleural effusion. Negative visible bowel gas pattern. No osseous abnormality identified. IMPRESSION: Negative.  No cardiopulmonary abnormality. Electronically Signed   By: Genevie Ann M.D.   On: 05/06/2018 16:34    Procedures Procedures (including critical care time)  Medications Ordered in ED Medications  LORazepam (ATIVAN) tablet 1 mg (1 mg Oral Given 05/06/18 1648)     Initial Impression / Assessment and Plan / ED Course  I have reviewed the triage vital signs and the nursing notes.  Pertinent labs & imaging results  that were available during my care of the patient were reviewed by me and considered in my medical decision making (see chart for details).     Patient presenting for evaluation of shortness of breath.  Initial exam shows patient is very tachypneic and anxious. Pt is able to speak in a full sentence and while responding to questions, tachypnea improves.  Labs and imaging from previous visit reviewed.  Will obtain basic labs, chest x-ray, EKG, and give Ativan and reassess.  Labs reassuring, no leukocytosis.  Hemoglobin stable.  I-STAT hCG mildly elevated, will obtain quant for further evaluation.  hCG is likely falsely elevated, consider normal hCG 2 days ago.  Chest x-ray viewed interpreted by me, no pneumonia, pneumothorax, effusion.  On reassessment, tachypnea is improved, patient breathing 20-30 times a minute.  She states she still feels like she is short of breath, but may be slightly improved.  We will continue to monitor.  hCG quant negative.  Discussed findings  with patient.  On reassessment, patient reports her breathing is better.  Tachypnea resolved, breathing 15-20 times a minute.  Sats remained stable.  Discussed with patient and family that symptoms are likely due to a combination of things, including anxiety, URI, albuterol use, prednisone, and Zoloft.  Discussed follow-up with psychiatry, which will be arranged by her PCP.  Will give Vistaril as needed for anxiety at home.  At this time, patient appears safe for discharge.  Return precautions given.  Patient states she understands and agrees to plan.   Final Clinical Impressions(s) / ED Diagnoses   Final diagnoses:  Shortness of breath    ED Discharge Orders         Ordered    hydrOXYzine (ATARAX/VISTARIL) 25 MG tablet  Every 8 hours PRN     05/06/18 1845           Franchot Heidelberg, PA-C 05/06/18 2010    Deno Etienne, DO 05/06/18 2310

## 2018-05-06 NOTE — ED Triage Notes (Signed)
Pt arrives to ED from her MD's office with complaints of a panic attack and SOB at her doctors office since 1400. EMS reports that patient has been breathing about 60 times a minute. Pt placed in position of comfort with bed locked and lowered, call bell in reach.

## 2018-05-06 NOTE — Discharge Instructions (Addendum)
Zoloft can cause rebound symptoms if Zoloft is stopped at once.  You should call your primary care doctor tomorrow not only to set up psychiatry follow-up, but also to discuss tapering the dose until you stop Zoloft. Use Vistaril as needed for anxiety or shortness of breath. Make sure you are staying well-hydrated with water. It is important that you follow-up with psychiatry for further management of your symptoms. Return to the emergency room with any new, worsening, concerning symptoms.

## 2018-05-12 DIAGNOSIS — R0602 Shortness of breath: Secondary | ICD-10-CM | POA: Diagnosis not present

## 2018-05-12 DIAGNOSIS — K219 Gastro-esophageal reflux disease without esophagitis: Secondary | ICD-10-CM | POA: Diagnosis not present

## 2018-05-12 DIAGNOSIS — F329 Major depressive disorder, single episode, unspecified: Secondary | ICD-10-CM | POA: Diagnosis not present

## 2018-05-26 DIAGNOSIS — F329 Major depressive disorder, single episode, unspecified: Secondary | ICD-10-CM | POA: Diagnosis not present

## 2018-05-26 DIAGNOSIS — J9801 Acute bronchospasm: Secondary | ICD-10-CM | POA: Diagnosis not present

## 2018-05-26 DIAGNOSIS — R0602 Shortness of breath: Secondary | ICD-10-CM | POA: Diagnosis not present

## 2018-08-12 DIAGNOSIS — J101 Influenza due to other identified influenza virus with other respiratory manifestations: Secondary | ICD-10-CM | POA: Diagnosis not present

## 2018-08-12 DIAGNOSIS — R6883 Chills (without fever): Secondary | ICD-10-CM | POA: Diagnosis not present

## 2018-08-18 DIAGNOSIS — R05 Cough: Secondary | ICD-10-CM | POA: Diagnosis not present

## 2018-08-18 DIAGNOSIS — J209 Acute bronchitis, unspecified: Secondary | ICD-10-CM | POA: Diagnosis not present

## 2018-08-18 DIAGNOSIS — R197 Diarrhea, unspecified: Secondary | ICD-10-CM | POA: Diagnosis not present

## 2018-08-25 DIAGNOSIS — R0602 Shortness of breath: Secondary | ICD-10-CM | POA: Diagnosis not present

## 2018-08-25 DIAGNOSIS — Z6836 Body mass index (BMI) 36.0-36.9, adult: Secondary | ICD-10-CM | POA: Diagnosis not present

## 2018-09-14 DIAGNOSIS — B349 Viral infection, unspecified: Secondary | ICD-10-CM | POA: Diagnosis not present

## 2018-09-15 DIAGNOSIS — J069 Acute upper respiratory infection, unspecified: Secondary | ICD-10-CM | POA: Diagnosis not present

## 2018-09-16 DIAGNOSIS — Z03818 Encounter for observation for suspected exposure to other biological agents ruled out: Secondary | ICD-10-CM | POA: Diagnosis not present

## 2018-09-16 DIAGNOSIS — R05 Cough: Secondary | ICD-10-CM | POA: Diagnosis not present

## 2018-12-21 DIAGNOSIS — N911 Secondary amenorrhea: Secondary | ICD-10-CM | POA: Diagnosis not present

## 2018-12-28 DIAGNOSIS — Z3481 Encounter for supervision of other normal pregnancy, first trimester: Secondary | ICD-10-CM | POA: Diagnosis not present

## 2018-12-28 DIAGNOSIS — Z3685 Encounter for antenatal screening for Streptococcus B: Secondary | ICD-10-CM | POA: Diagnosis not present

## 2018-12-28 LAB — OB RESULTS CONSOLE ABO/RH: RH Type: POSITIVE

## 2018-12-28 LAB — OB RESULTS CONSOLE HEPATITIS B SURFACE ANTIGEN: Hepatitis B Surface Ag: NEGATIVE

## 2018-12-28 LAB — OB RESULTS CONSOLE HIV ANTIBODY (ROUTINE TESTING): HIV: NONREACTIVE

## 2018-12-28 LAB — OB RESULTS CONSOLE GC/CHLAMYDIA
Chlamydia: NEGATIVE
Gonorrhea: NEGATIVE

## 2018-12-28 LAB — OB RESULTS CONSOLE ANTIBODY SCREEN: Antibody Screen: NEGATIVE

## 2018-12-28 LAB — OB RESULTS CONSOLE RPR: RPR: NONREACTIVE

## 2018-12-28 LAB — OB RESULTS CONSOLE RUBELLA ANTIBODY, IGM: Rubella: IMMUNE

## 2019-01-05 DIAGNOSIS — Z319 Encounter for procreative management, unspecified: Secondary | ICD-10-CM | POA: Diagnosis not present

## 2019-01-05 DIAGNOSIS — Z331 Pregnant state, incidental: Secondary | ICD-10-CM | POA: Diagnosis not present

## 2019-01-05 DIAGNOSIS — Z3401 Encounter for supervision of normal first pregnancy, first trimester: Secondary | ICD-10-CM | POA: Diagnosis not present

## 2019-01-05 DIAGNOSIS — Z113 Encounter for screening for infections with a predominantly sexual mode of transmission: Secondary | ICD-10-CM | POA: Diagnosis not present

## 2019-01-05 DIAGNOSIS — Z124 Encounter for screening for malignant neoplasm of cervix: Secondary | ICD-10-CM | POA: Diagnosis not present

## 2019-01-18 DIAGNOSIS — Z3A12 12 weeks gestation of pregnancy: Secondary | ICD-10-CM | POA: Diagnosis not present

## 2019-01-18 DIAGNOSIS — Z3482 Encounter for supervision of other normal pregnancy, second trimester: Secondary | ICD-10-CM | POA: Diagnosis not present

## 2019-01-18 DIAGNOSIS — Z3682 Encounter for antenatal screening for nuchal translucency: Secondary | ICD-10-CM | POA: Diagnosis not present

## 2019-02-04 DIAGNOSIS — R05 Cough: Secondary | ICD-10-CM | POA: Diagnosis not present

## 2019-02-04 DIAGNOSIS — Z03818 Encounter for observation for suspected exposure to other biological agents ruled out: Secondary | ICD-10-CM | POA: Diagnosis not present

## 2019-02-04 DIAGNOSIS — R52 Pain, unspecified: Secondary | ICD-10-CM | POA: Diagnosis not present

## 2019-02-04 DIAGNOSIS — R5383 Other fatigue: Secondary | ICD-10-CM | POA: Diagnosis not present

## 2019-02-25 DIAGNOSIS — Z361 Encounter for antenatal screening for raised alphafetoprotein level: Secondary | ICD-10-CM | POA: Diagnosis not present

## 2019-02-25 DIAGNOSIS — Z363 Encounter for antenatal screening for malformations: Secondary | ICD-10-CM | POA: Diagnosis not present

## 2019-02-25 DIAGNOSIS — Z3402 Encounter for supervision of normal first pregnancy, second trimester: Secondary | ICD-10-CM | POA: Diagnosis not present

## 2019-02-25 DIAGNOSIS — Z3A18 18 weeks gestation of pregnancy: Secondary | ICD-10-CM | POA: Diagnosis not present

## 2019-03-29 DIAGNOSIS — Z23 Encounter for immunization: Secondary | ICD-10-CM | POA: Diagnosis not present

## 2019-03-29 DIAGNOSIS — Z3402 Encounter for supervision of normal first pregnancy, second trimester: Secondary | ICD-10-CM | POA: Diagnosis not present

## 2019-03-29 DIAGNOSIS — Z3A22 22 weeks gestation of pregnancy: Secondary | ICD-10-CM | POA: Diagnosis not present

## 2019-03-29 DIAGNOSIS — Z362 Encounter for other antenatal screening follow-up: Secondary | ICD-10-CM | POA: Diagnosis not present

## 2019-04-30 DIAGNOSIS — Z23 Encounter for immunization: Secondary | ICD-10-CM | POA: Diagnosis not present

## 2019-04-30 DIAGNOSIS — Z3480 Encounter for supervision of other normal pregnancy, unspecified trimester: Secondary | ICD-10-CM | POA: Diagnosis not present

## 2019-04-30 DIAGNOSIS — D649 Anemia, unspecified: Secondary | ICD-10-CM | POA: Diagnosis not present

## 2019-04-30 DIAGNOSIS — Z3400 Encounter for supervision of normal first pregnancy, unspecified trimester: Secondary | ICD-10-CM | POA: Diagnosis not present

## 2019-05-10 DIAGNOSIS — O99810 Abnormal glucose complicating pregnancy: Secondary | ICD-10-CM | POA: Diagnosis not present

## 2019-05-19 DIAGNOSIS — N760 Acute vaginitis: Secondary | ICD-10-CM | POA: Diagnosis not present

## 2019-05-19 DIAGNOSIS — Z3400 Encounter for supervision of normal first pregnancy, unspecified trimester: Secondary | ICD-10-CM | POA: Diagnosis not present

## 2019-05-27 DIAGNOSIS — O99019 Anemia complicating pregnancy, unspecified trimester: Secondary | ICD-10-CM | POA: Diagnosis not present

## 2019-06-08 ENCOUNTER — Encounter (HOSPITAL_COMMUNITY): Payer: Self-pay | Admitting: Obstetrics and Gynecology

## 2019-06-08 ENCOUNTER — Inpatient Hospital Stay (HOSPITAL_COMMUNITY)
Admission: AD | Admit: 2019-06-08 | Discharge: 2019-06-08 | Disposition: A | Discharge status: Home / Self Care | Payer: Commercial Managed Care - PPO | Attending: Obstetrics and Gynecology | Admitting: Obstetrics and Gynecology

## 2019-06-08 ENCOUNTER — Other Ambulatory Visit: Payer: Self-pay

## 2019-06-08 DIAGNOSIS — O133 Gestational [pregnancy-induced] hypertension without significant proteinuria, third trimester: Secondary | ICD-10-CM | POA: Diagnosis not present

## 2019-06-08 DIAGNOSIS — Z3A32 32 weeks gestation of pregnancy: Secondary | ICD-10-CM | POA: Diagnosis not present

## 2019-06-08 HISTORY — DX: Unspecified asthma, uncomplicated: J45.909

## 2019-06-08 LAB — COMPREHENSIVE METABOLIC PANEL
ALT: 15 U/L (ref 0–44)
AST: 17 U/L (ref 15–41)
Albumin: 2.8 g/dL — ABNORMAL LOW (ref 3.5–5.0)
Alkaline Phosphatase: 76 U/L (ref 38–126)
Anion gap: 10 (ref 5–15)
BUN: 5 mg/dL — ABNORMAL LOW (ref 6–20)
CO2: 20 mmol/L — ABNORMAL LOW (ref 22–32)
Calcium: 9.5 mg/dL (ref 8.9–10.3)
Chloride: 106 mmol/L (ref 98–111)
Creatinine, Ser: 0.58 mg/dL (ref 0.44–1.00)
GFR calc Af Amer: >60 mL/min (ref >60)
GFR calc non Af Amer: >60 mL/min (ref >60)
Glucose, Bld: 87 mg/dL (ref 70–99)
Potassium: 3.2 mmol/L — ABNORMAL LOW (ref 3.5–5.1)
Sodium: 136 mmol/L (ref 135–145)
Total Bilirubin: 0.3 mg/dL (ref 0.3–1.2)
Total Protein: 6.1 g/dL — ABNORMAL LOW (ref 6.5–8.1)

## 2019-06-08 LAB — CBC
HCT: 29.1 % — ABNORMAL LOW (ref 36.0–46.0)
Hemoglobin: 10.3 g/dL — ABNORMAL LOW (ref 12.0–15.0)
MCH: 32 pg (ref 26.0–34.0)
MCHC: 35.4 g/dL (ref 30.0–36.0)
MCV: 90.4 fL (ref 80.0–100.0)
Platelets: 253 10*3/uL (ref 150–400)
RBC: 3.22 MIL/uL — ABNORMAL LOW (ref 3.87–5.11)
RDW: 13.5 % (ref 11.5–15.5)
WBC: 9.7 10*3/uL (ref 4.0–10.5)
nRBC: 0 % (ref 0.0–0.2)

## 2019-06-08 LAB — PROTEIN / CREATININE RATIO, URINE
Creatinine, Urine: 37.01 mg/dL
Total Protein, Urine: <6 mg/dL

## 2019-06-08 NOTE — MAU Note (Signed)
Felt her BP going up, checked at [facility] 160/90, called on call nurse and they sent her here. Was feeling really flushed, tired. BP has been going up last few wks. Denies HA, seeing a little bit of spots- "normal these past couple wks", denies epigastric pain or increase in swelling.

## 2019-06-08 NOTE — MAU Provider Note (Signed)
Chief Complaint  Patient presents with  . Hypertension     First Provider Initiated Contact with Patient 06/08/19 1931      S: Misty [name]  is a 28 y.o. y.o. year old G30P0 female at [redacted]w[redacted]d weeks gestation who presents to MAU with elevated blood pressures. Reports Hx of hypertension in the office since 3 weeks ago. Denies hypertension outside of pregnancy. Current blood pressure medication: none.  States she occasionally sees black flashes in her vision for the last several weeks.  States she felt flushed at work last night & today; BP was 160s/90s yesterday and today.   Associated symptoms: denies Headache, denies vision changes, denies epigastric pain Contractions: denies Vaginal bleeding: denies Fetal movement: good  O:  Patient Vitals for the past 24 hrs:  BP Temp Temp src Pulse Resp SpO2 Height Weight  06/08/19 2016 130/67 - - 95 - - - -  06/08/19 2001 137/86 - - (!) 111 - - - -  06/08/19 1946 131/73 - - 99 - - - -  06/08/19 1931 140/70 - - (!) 101 - - - -  06/08/19 1916 (!) 142/80 - - (!) 102 - - - -  06/08/19 1913 (!) 142/77 - - (!) 103 - - - -  06/08/19 1841 (!) 142/82 98.3 F (36.8 C) Oral (!) 102 18 99 % 5\' 5"  (1.651 m) 115.5 kg   General: NAD Heart: Regular rate Lungs: Normal rate and effort Abd: Soft, NT, Gravid, S=D Extremities: non pitting Pedal edema Neuro: 2+ deep tendon reflexes, No clonus  NST:  Baseline: 150 bpm, Variability: Good {> 6 bpm), Accelerations: Reactive and Decelerations: Absent  Results for orders placed or performed during the hospital encounter of 06/08/19 (from the past 24 hour(s))  Protein / creatinine ratio, urine     Status: None   Collection Time: 06/08/19  7:22 PM  Result Value Ref Range   Creatinine, Urine 37.01 mg/dL   Total Protein, Urine <6 mg/dL   Protein Creatinine Ratio        0.00 - 0.15 mg/mg[Cre]  CBC     Status: Abnormal   Collection Time: 06/08/19  7:39 PM  Result Value Ref Range   WBC 9.7 4.0 - 10.5 K/uL   RBC  3.22 (L) 3.87 - 5.11 MIL/uL   Hemoglobin 10.3 (L) 12.0 - 15.0 g/dL   HCT 29.1 (L) 36.0 - 46.0 %   MCV 90.4 80.0 - 100.0 fL   MCH 32.0 26.0 - 34.0 pg   MCHC 35.4 30.0 - 36.0 g/dL   RDW 13.5 11.5 - 15.5 %   Platelets 253 150 - 400 K/uL   nRBC 0.0 0.0 - 0.2 %  Comprehensive metabolic panel     Status: Abnormal   Collection Time: 06/08/19  7:39 PM  Result Value Ref Range   Sodium 136 135 - 145 mmol/L   Potassium 3.2 (L) 3.5 - 5.1 mmol/L   Chloride 106 98 - 111 mmol/L   CO2 20 (L) 22 - 32 mmol/L   Glucose, Bld 87 70 - 99 mg/dL   BUN 5 (L) 6 - 20 mg/dL   Creatinine, Ser 0.58 0.44 - 1.00 mg/dL   Calcium 9.5 8.9 - 10.3 mg/dL   Total Protein 6.1 (L) 6.5 - 8.1 g/dL   Albumin 2.8 (L) 3.5 - 5.0 g/dL   AST 17 15 - 41 U/L   ALT 15 0 - 44 U/L   Alkaline Phosphatase 76 38 - 126 U/L   Total Bilirubin 0.3  0.3 - 1.2 mg/dL   GFR calc non Af Amer >60 >60 mL/min   GFR calc Af Amer >60 >60 mL/min   Anion gap 10 5 - 15    MDM  Elevated BPs. None severe range. No severe features Based on report of elevated BPs in the office prior to today, pt given diagnosis of gestational hypertension.  PEC labs normal  A:  1. Gestational hypertension, third trimester   2. [redacted] weeks gestation of pregnancy      P:  Discharge home in stable conditiong Reviewed preeclampsia precautions & reasons to return to MAU Keep Ob appointment on Thursday  Jorje Guild, NP 06/08/2019 8:22 PM

## 2019-06-08 NOTE — Discharge Instructions (Signed)
Hypertension During Pregnancy High blood pressure (hypertension) is when the force of blood pumping through the arteries is too strong. Arteries are blood vessels that carry blood from the heart throughout the body. Hypertension during pregnancy can be mild or severe. Severe hypertension during pregnancy (preeclampsia) is a medical emergency that requires prompt evaluation and treatment. Different types of hypertension can happen during pregnancy. These include:  Chronic hypertension. This happens when you had high blood pressure before you became pregnant, and it continues during the pregnancy. Hypertension that develops before you are [redacted] weeks pregnant and continues during the pregnancy is also called chronic hypertension. If you have chronic hypertension, it will not go away after you have your baby. You will need follow-up visits with your health care provider after you have your baby. Your doctor may want you to keep taking medicine for your blood pressure.  Gestational hypertension. This is hypertension that develops after the 20th week of pregnancy. Gestational hypertension usually goes away after you have your baby, but your health care provider will need to monitor your blood pressure to make sure that it is getting better.  Preeclampsia. This is severe hypertension during pregnancy. This can cause serious complications for you and your baby and can also cause complications for you after the delivery of your baby.  Postpartum preeclampsia. You may develop severe hypertension after giving birth. This usually occurs within 48 hours after childbirth but may occur up to 6 weeks after giving birth. This is rare. How does this affect me? Women who have hypertension during pregnancy have a greater chance of developing hypertension later in life or during future pregnancies. In some cases, hypertension during pregnancy can cause serious complications, such as:  Stroke.  Heart attack.  Injury to  other organs, such as kidneys, lungs, or liver.  Preeclampsia.  Convulsions or seizures.  Placental abruption. How does this affect my baby? Hypertension during pregnancy can affect your baby. Your baby may:  Be born early (prematurely).  Not weigh as much as he or she should at birth (low birth weight).  Not tolerate labor well, leading to an unplanned cesarean delivery. What are the risks? There are certain factors that make it more likely for you to develop hypertension during pregnancy. These include:  Having hypertension during a previous pregnancy.  Being overweight.  Being age 35 or older.  Being pregnant for the first time.  Being pregnant with more than one baby.  Becoming pregnant using fertilization methods, such as IVF (in vitro fertilization).  Having other medical problems, such as diabetes, kidney disease, or lupus.  Having a family history of hypertension. What can I do to lower my risk? The exact cause of hypertension during pregnancy is not known. You may be able to lower your risk by:  Maintaining a healthy weight.  Eating a healthy and balanced diet.  Following your health care provider's instructions about treating any long-term conditions that you had before becoming pregnant. It is very important to keep all of your prenatal care appointments. Your health care provider will check your blood pressure and make sure that your pregnancy is progressing as expected. If a problem is found, early treatment can prevent complications. How is this treated? Treatment for hypertension during pregnancy varies depending on the type of hypertension you have and how serious it is.  If you were taking medicine for high blood pressure before you became pregnant, talk with your health care provider. You may need to change medicine during pregnancy because   some medicines, like ACE inhibitors, may not be considered safe for your baby.  If you have gestational  hypertension, your health care provider may order medicine to treat this during pregnancy.  If you are at risk for preeclampsia, your health care provider may recommend that you take a low-dose aspirin during your pregnancy.  If you have severe hypertension, you may need to be hospitalized so you and your baby can be monitored closely. You may also need to be given medicine to lower your blood pressure. This medicine may be given by mouth or through an IV.  In some cases, if your condition gets worse, you may need to deliver your baby early. Follow these instructions at home: Eating and drinking   Drink enough fluid to keep your urine pale yellow.  Avoid caffeine. Lifestyle  Do not use any products that contain nicotine or tobacco, such as cigarettes, e-cigarettes, and chewing tobacco. If you need help quitting, ask your health care provider.  Do not use alcohol or drugs.  Avoid stress as much as possible.  Rest and get plenty of sleep.  Regular exercise can help to reduce your blood pressure. Ask your health care provider what kinds of exercise are best for you. General instructions  Take over-the-counter and prescription medicines only as told by your health care provider.  Keep all prenatal and follow-up visits as told by your health care provider. This is important. Contact a health care provider if:  You have symptoms that your health care provider told you may require more treatment or monitoring, such as: ? Headaches. ? Nausea or vomiting. ? Abdominal pain. ? Dizziness. ? Light-headedness. Get help right away if:  You have: ? Severe abdominal pain that does not get better with treatment. ? A severe headache that does not get better. ? Vomiting that does not get better. ? Sudden, rapid weight gain. ? Sudden swelling in your hands, ankles, or face. ? Vaginal bleeding. ? Blood in your urine. ? Blurred or double vision. ? Shortness of breath or chest  pain. ? Weakness on one side of your body. ? Difficulty speaking.  Your baby is not moving as much as usual. Summary  High blood pressure (hypertension) is when the force of blood pumping through the arteries is too strong.  Hypertension during pregnancy can cause problems for you and your baby.  Treatment for hypertension during pregnancy varies depending on the type of hypertension you have and how serious it is.  Keep all prenatal and follow-up visits as told by your health care provider. This is important. This information is not intended to replace advice given to you by your health care provider. Make sure you discuss any questions you have with your health care provider. Document Released: 02/26/2011 Document Revised: 10/01/2018 Document Reviewed: 07/07/2018 Elsevier Patient Education  2020 [place].     Fetal Movement Counts Patient Name: ________________________________________________ Patient Due Date: ____________________ What is a fetal movement count?  A fetal movement count is the number of times that you feel your baby move during a certain amount of time. This may also be called a fetal kick count. A fetal movement count is recommended for every pregnant woman. You may be asked to start counting fetal movements as early as week 28 of your pregnancy. Pay attention to when your baby is most active. You may notice your baby's sleep and wake cycles. You may also notice things that make your baby move more. You should do a fetal movement  count:  When your baby is normally most active.  At the same time each day. A good time to count movements is while you are resting, after having something to eat and drink. How do I count fetal movements? 1. Find a quiet, comfortable area. Sit, or lie down on your side. 2. Write down the date, the start time and stop time, and the number of movements that you felt between those two times. Take this information with you to your  health care visits. 3. For 2 hours, count kicks, flutters, swishes, rolls, and jabs. You should feel at least 10 movements during 2 hours. 4. You may stop counting after you have felt 10 movements. 5. If you do not feel 10 movements in 2 hours, have something to eat and drink. Then, keep resting and counting for 1 hour. If you feel at least 4 movements during that hour, you may stop counting. Contact a health care provider if:  You feel fewer than 4 movements in 2 hours.  Your baby is not moving like he or she usually does. Date: ____________ Start time: ____________ Stop time: ____________ Movements: ____________ Date: ____________ Start time: ____________ Stop time: ____________ Movements: ____________ Date: ____________ Start time: ____________ Stop time: ____________ Movements: ____________ Date: ____________ Start time: ____________ Stop time: ____________ Movements: ____________ Date: ____________ Start time: ____________ Stop time: ____________ Movements: ____________ Date: ____________ Start time: ____________ Stop time: ____________ Movements: ____________ Date: ____________ Start time: ____________ Stop time: ____________ Movements: ____________ Date: ____________ Start time: ____________ Stop time: ____________ Movements: ____________ Date: ____________ Start time: ____________ Stop time: ____________ Movements: ____________ This information is not intended to replace advice given to you by your health care provider. Make sure you discuss any questions you have with your health care provider. Document Released: 07/10/2006 Document Revised: 06/30/2018 Document Reviewed: 07/20/2015 Elsevier Patient Education  2020 [employer].

## 2019-06-10 DIAGNOSIS — Z3403 Encounter for supervision of normal first pregnancy, third trimester: Secondary | ICD-10-CM | POA: Diagnosis not present

## 2019-06-10 DIAGNOSIS — O163 Unspecified maternal hypertension, third trimester: Secondary | ICD-10-CM | POA: Diagnosis not present

## 2019-06-10 DIAGNOSIS — Z3A33 33 weeks gestation of pregnancy: Secondary | ICD-10-CM | POA: Diagnosis not present

## 2019-06-16 DIAGNOSIS — Z3A33 33 weeks gestation of pregnancy: Secondary | ICD-10-CM | POA: Diagnosis not present

## 2019-06-16 DIAGNOSIS — Z3400 Encounter for supervision of normal first pregnancy, unspecified trimester: Secondary | ICD-10-CM | POA: Diagnosis not present

## 2019-06-16 DIAGNOSIS — O99891 Other specified diseases and conditions complicating pregnancy: Secondary | ICD-10-CM | POA: Diagnosis not present

## 2019-06-24 DIAGNOSIS — O99891 Other specified diseases and conditions complicating pregnancy: Secondary | ICD-10-CM | POA: Diagnosis not present

## 2019-06-24 DIAGNOSIS — Z361 Encounter for antenatal screening for raised alphafetoprotein level: Secondary | ICD-10-CM | POA: Diagnosis not present

## 2019-06-24 DIAGNOSIS — Z3400 Encounter for supervision of normal first pregnancy, unspecified trimester: Secondary | ICD-10-CM | POA: Diagnosis not present

## 2019-06-24 DIAGNOSIS — N760 Acute vaginitis: Secondary | ICD-10-CM | POA: Diagnosis not present

## 2019-06-24 DIAGNOSIS — Z3A35 35 weeks gestation of pregnancy: Secondary | ICD-10-CM | POA: Diagnosis not present

## 2019-06-25 DIAGNOSIS — E063 Autoimmune thyroiditis: Secondary | ICD-10-CM

## 2019-06-25 HISTORY — DX: Autoimmune thyroiditis: E06.3

## 2019-07-01 DIAGNOSIS — Z3400 Encounter for supervision of normal first pregnancy, unspecified trimester: Secondary | ICD-10-CM | POA: Diagnosis not present

## 2019-07-01 DIAGNOSIS — O99891 Other specified diseases and conditions complicating pregnancy: Secondary | ICD-10-CM | POA: Diagnosis not present

## 2019-07-05 ENCOUNTER — Encounter (HOSPITAL_COMMUNITY): Payer: Self-pay | Admitting: *Deleted

## 2019-07-05 ENCOUNTER — Telehealth (HOSPITAL_COMMUNITY): Payer: Self-pay | Admitting: *Deleted

## 2019-07-05 LAB — OB RESULTS CONSOLE GBS: GBS: POSITIVE

## 2019-07-05 NOTE — Telephone Encounter (Signed)
Preadmission screen  

## 2019-07-09 DIAGNOSIS — O133 Gestational [pregnancy-induced] hypertension without significant proteinuria, third trimester: Secondary | ICD-10-CM | POA: Diagnosis not present

## 2019-07-09 DIAGNOSIS — Z3400 Encounter for supervision of normal first pregnancy, unspecified trimester: Secondary | ICD-10-CM | POA: Diagnosis not present

## 2019-07-09 DIAGNOSIS — O3663X0 Maternal care for excessive fetal growth, third trimester, not applicable or unspecified: Secondary | ICD-10-CM | POA: Diagnosis not present

## 2019-07-09 DIAGNOSIS — Z3A37 37 weeks gestation of pregnancy: Secondary | ICD-10-CM | POA: Diagnosis not present

## 2019-07-13 ENCOUNTER — Other Ambulatory Visit (HOSPITAL_COMMUNITY)
Admission: RE | Admit: 2019-07-13 | Discharge: 2019-07-13 | Disposition: A | Discharge status: Home / Self Care | Payer: 59 | Source: Ambulatory Visit | Attending: Obstetrics and Gynecology | Admitting: Obstetrics and Gynecology

## 2019-07-13 DIAGNOSIS — O134 Gestational [pregnancy-induced] hypertension without significant proteinuria, complicating childbirth: Secondary | ICD-10-CM | POA: Diagnosis not present

## 2019-07-13 DIAGNOSIS — O9902 Anemia complicating childbirth: Secondary | ICD-10-CM | POA: Diagnosis not present

## 2019-07-13 DIAGNOSIS — O99824 Streptococcus B carrier state complicating childbirth: Secondary | ICD-10-CM | POA: Diagnosis not present

## 2019-07-13 DIAGNOSIS — O99214 Obesity complicating childbirth: Secondary | ICD-10-CM | POA: Diagnosis not present

## 2019-07-13 DIAGNOSIS — D649 Anemia, unspecified: Secondary | ICD-10-CM | POA: Diagnosis not present

## 2019-07-13 DIAGNOSIS — U071 COVID-19: Secondary | ICD-10-CM | POA: Diagnosis not present

## 2019-07-13 DIAGNOSIS — O9852 Other viral diseases complicating childbirth: Secondary | ICD-10-CM | POA: Diagnosis not present

## 2019-07-13 LAB — SARS CORONAVIRUS 2 (TAT 6-24 HRS): SARS Coronavirus 2: POSITIVE — AB

## 2019-07-13 NOTE — H&P (Signed)
Misty [name] is a 29 y.o. female presenting for two stage IOL. Pregnancy complicated by gestational hypertension. BP 134-150/80s. U/S in office on 07/09/19 notes EFW 8# 10oz (97%0 BPP 8/8 Vtx. 3 hour GTT normal. OB History    Gravida  1   Para      Term      Preterm      AB      Living        SAB      TAB      Ectopic      Multiple      Live Births             Past Medical History:  Diagnosis Date  . Anemia   . Asthma   . Pregnancy induced hypertension    Past Surgical History:  Procedure Laterality Date  . NO PAST SURGERIES     Family History: family history includes Hypertension in her father. She was adopted. Social History:  reports that she has never smoked. She has never used smokeless tobacco. She reports that she does not drink alcohol or use drugs.     Maternal Diabetes: No Genetic Screening: Normal Maternal Ultrasounds/Referrals: Normal Fetal Ultrasounds or other Referrals:  None Maternal Substance Abuse:  No Significant Maternal Medications:  None Significant Maternal Lab Results:  Group B Strep positive Other Comments:  None  Review of Systems  Eyes: Negative for visual disturbance.  Gastrointestinal: Negative for abdominal pain.  Neurological: Negative for headaches.   Maternal Medical History:  Fetal activity: Perceived fetal activity is normal.        There were no vitals taken for this visit. Exam Physical Exam  Cardiovascular: Normal rate.  Respiratory: Effort normal.  GI: Soft.    Prenatal labs: ABO, Rh: B/Positive/-- (07/06 0000) Antibody: Negative (07/06 0000) Rubella: Immune (07/06 0000) RPR: Nonreactive (07/06 0000)  HBsAg: Negative (07/06 0000)  HIV: Non-reactive (07/06 0000)  GBS: Positive/-- (01/11 0000)  GBBS  06/24/19 Assessment/Plan: 29 yo G1P0 @ 38 0/[redacted] weeks Gestational Hypertension Two stage IOL   Shon Millet II 07/13/2019, 1:45 PM

## 2019-07-14 ENCOUNTER — Encounter: Payer: Self-pay | Admitting: Medical

## 2019-07-14 DIAGNOSIS — U071 COVID-19: Secondary | ICD-10-CM

## 2019-07-14 HISTORY — DX: COVID-19: U07.1

## 2019-07-14 NOTE — Progress Notes (Signed)
Patient with positive covid result. Contacted Dr. Sherlynn Stalls office, spoke with Nira Conn and informed of result.

## 2019-07-15 ENCOUNTER — Inpatient Hospital Stay (HOSPITAL_COMMUNITY): Payer: 59 | Admitting: Anesthesiology

## 2019-07-15 ENCOUNTER — Encounter (HOSPITAL_COMMUNITY): Payer: Self-pay | Admitting: Obstetrics and Gynecology

## 2019-07-15 ENCOUNTER — Inpatient Hospital Stay (HOSPITAL_COMMUNITY)
Admission: AD | Admit: 2019-07-15 | Discharge: 2019-07-17 | DRG: 786 | Disposition: A | Discharge status: Home / Self Care | Payer: 59 | Attending: Obstetrics and Gynecology | Admitting: Obstetrics and Gynecology

## 2019-07-15 ENCOUNTER — Encounter (HOSPITAL_COMMUNITY)
Admission: AD | Disposition: A | Discharge status: Home / Self Care | Payer: Self-pay | Source: Home / Self Care | Attending: Obstetrics and Gynecology

## 2019-07-15 ENCOUNTER — Inpatient Hospital Stay (HOSPITAL_COMMUNITY): Payer: 59

## 2019-07-15 ENCOUNTER — Other Ambulatory Visit: Payer: Self-pay

## 2019-07-15 DIAGNOSIS — Z01812 Encounter for preprocedural laboratory examination: Secondary | ICD-10-CM

## 2019-07-15 DIAGNOSIS — D252 Subserosal leiomyoma of uterus: Secondary | ICD-10-CM | POA: Diagnosis present

## 2019-07-15 DIAGNOSIS — Z87891 Personal history of nicotine dependence: Secondary | ICD-10-CM

## 2019-07-15 DIAGNOSIS — O3413 Maternal care for benign tumor of corpus uteri, third trimester: Secondary | ICD-10-CM | POA: Diagnosis present

## 2019-07-15 DIAGNOSIS — O133 Gestational [pregnancy-induced] hypertension without significant proteinuria, third trimester: Secondary | ICD-10-CM

## 2019-07-15 DIAGNOSIS — Z3A38 38 weeks gestation of pregnancy: Secondary | ICD-10-CM

## 2019-07-15 DIAGNOSIS — O134 Gestational [pregnancy-induced] hypertension without significant proteinuria, complicating childbirth: Principal | ICD-10-CM | POA: Diagnosis present

## 2019-07-15 DIAGNOSIS — U071 COVID-19: Secondary | ICD-10-CM | POA: Diagnosis present

## 2019-07-15 DIAGNOSIS — O164 Unspecified maternal hypertension, complicating childbirth: Secondary | ICD-10-CM | POA: Diagnosis not present

## 2019-07-15 DIAGNOSIS — D649 Anemia, unspecified: Secondary | ICD-10-CM | POA: Diagnosis present

## 2019-07-15 DIAGNOSIS — O99214 Obesity complicating childbirth: Secondary | ICD-10-CM | POA: Diagnosis present

## 2019-07-15 DIAGNOSIS — O9902 Anemia complicating childbirth: Secondary | ICD-10-CM | POA: Diagnosis present

## 2019-07-15 DIAGNOSIS — O9852 Other viral diseases complicating childbirth: Secondary | ICD-10-CM | POA: Diagnosis not present

## 2019-07-15 DIAGNOSIS — O99824 Streptococcus B carrier state complicating childbirth: Secondary | ICD-10-CM | POA: Diagnosis present

## 2019-07-15 DIAGNOSIS — O139 Gestational [pregnancy-induced] hypertension without significant proteinuria, unspecified trimester: Secondary | ICD-10-CM | POA: Diagnosis present

## 2019-07-15 LAB — CBC
HCT: 31.2 % — ABNORMAL LOW (ref 36.0–46.0)
Hemoglobin: 10.8 g/dL — ABNORMAL LOW (ref 12.0–15.0)
MCH: 31.3 pg (ref 26.0–34.0)
MCHC: 34.6 g/dL (ref 30.0–36.0)
MCV: 90.4 fL (ref 80.0–100.0)
Platelets: 264 10*3/uL (ref 150–400)
RBC: 3.45 MIL/uL — ABNORMAL LOW (ref 3.87–5.11)
RDW: 13.9 % (ref 11.5–15.5)
WBC: 11.4 10*3/uL — ABNORMAL HIGH (ref 4.0–10.5)
nRBC: 0 % (ref 0.0–0.2)

## 2019-07-15 LAB — TYPE AND SCREEN
ABO/RH(D): B POS
Antibody Screen: NEGATIVE

## 2019-07-15 LAB — COMPREHENSIVE METABOLIC PANEL
ALT: 15 U/L (ref 0–44)
AST: 15 U/L (ref 15–41)
Albumin: 2.8 g/dL — ABNORMAL LOW (ref 3.5–5.0)
Alkaline Phosphatase: 96 U/L (ref 38–126)
Anion gap: 10 (ref 5–15)
BUN: 9 mg/dL (ref 6–20)
CO2: 21 mmol/L — ABNORMAL LOW (ref 22–32)
Calcium: 9.8 mg/dL (ref 8.9–10.3)
Chloride: 105 mmol/L (ref 98–111)
Creatinine, Ser: 0.65 mg/dL (ref 0.44–1.00)
GFR calc Af Amer: >60 mL/min (ref >60)
GFR calc non Af Amer: >60 mL/min (ref >60)
Glucose, Bld: 120 mg/dL — ABNORMAL HIGH (ref 70–99)
Potassium: 3.5 mmol/L (ref 3.5–5.1)
Sodium: 136 mmol/L (ref 135–145)
Total Bilirubin: 0.3 mg/dL (ref 0.3–1.2)
Total Protein: 6 g/dL — ABNORMAL LOW (ref 6.5–8.1)

## 2019-07-15 LAB — RPR: RPR Ser Ql: NONREACTIVE

## 2019-07-15 LAB — ABO/RH: ABO/RH(D): B POS

## 2019-07-15 SURGERY — Surgical Case
Anesthesia: Spinal

## 2019-07-15 MED ORDER — SOD CITRATE-CITRIC ACID 500-334 MG/5ML PO SOLN: 30.0000 mL | ORAL | Status: DC

## 2019-07-15 MED ORDER — FLEET ENEMA 7-19 GM/118ML RE ENEM: 1.0000 | ENEMA | RECTAL | Status: DC | PRN

## 2019-07-15 MED ORDER — FENTANYL CITRATE (PF) 100 MCG/2ML IJ SOLN
50.0000 ug | INTRAMUSCULAR | Status: DC | PRN
  Administered 2019-07-15: 100 ug via INTRAVENOUS
  Filled 2019-07-15: qty 2

## 2019-07-15 MED ORDER — COCONUT OIL OIL: 1.0000 "application " | TOPICAL_OIL | Status: DC | PRN

## 2019-07-15 MED ORDER — OXYCODONE HCL 5 MG/5ML PO SOLN: 5.0000 mg | Freq: Once | ORAL | Status: DC | PRN

## 2019-07-15 MED ORDER — OXYTOCIN 40 UNITS IN NORMAL SALINE INFUSION - SIMPLE MED
INTRAVENOUS | Status: AC
  Filled 2019-07-15: qty 1000

## 2019-07-15 MED ORDER — DIPHENHYDRAMINE HCL 50 MG/ML IJ SOLN: 12.5000 mg | INTRAMUSCULAR | Status: DC | PRN

## 2019-07-15 MED ORDER — OXYCODONE-ACETAMINOPHEN 5-325 MG PO TABS: 1.0000 | ORAL_TABLET | ORAL | Status: DC | PRN

## 2019-07-15 MED ORDER — PRENATAL MULTIVITAMIN CH
1.0000 | ORAL_TABLET | Freq: Every day | ORAL | Status: DC
  Administered 2019-07-16 – 2019-07-17 (×2): 1 via ORAL
  Filled 2019-07-15 (×2): qty 1

## 2019-07-15 MED ORDER — DEXTROSE 5 % IV SOLN
INTRAVENOUS | Status: DC | PRN
  Administered 2019-07-15: 3 g via INTRAVENOUS

## 2019-07-15 MED ORDER — LIDOCAINE HCL (PF) 1 % IJ SOLN: 30.0000 mL | INTRAMUSCULAR | Status: DC | PRN

## 2019-07-15 MED ORDER — NALBUPHINE HCL 10 MG/ML IJ SOLN: 5.0000 mg | INTRAMUSCULAR | Status: DC | PRN

## 2019-07-15 MED ORDER — SODIUM CHLORIDE 0.9% FLUSH: 3.0000 mL | INTRAVENOUS | Status: DC | PRN

## 2019-07-15 MED ORDER — HYDROMORPHONE HCL 1 MG/ML IJ SOLN: 0.2500 mg | INTRAMUSCULAR | Status: DC | PRN

## 2019-07-15 MED ORDER — KETOROLAC TROMETHAMINE 30 MG/ML IJ SOLN: 30.0000 mg | Freq: Four times a day (QID) | INTRAMUSCULAR | Status: DC | PRN

## 2019-07-15 MED ORDER — SODIUM CHLORIDE 0.9 % IV SOLN
5.0000 10*6.[IU] | Freq: Once | INTRAVENOUS | Status: AC
  Administered 2019-07-15: 5 10*6.[IU] via INTRAVENOUS
  Filled 2019-07-15: qty 5

## 2019-07-15 MED ORDER — IBUPROFEN 600 MG PO TABS
600.0000 mg | ORAL_TABLET | Freq: Four times a day (QID) | ORAL | Status: DC | PRN
  Administered 2019-07-16 – 2019-07-17 (×2): 600 mg via ORAL
  Filled 2019-07-15 (×2): qty 1

## 2019-07-15 MED ORDER — PROMETHAZINE HCL 25 MG/ML IJ SOLN: 6.2500 mg | INTRAMUSCULAR | Status: DC | PRN

## 2019-07-15 MED ORDER — FENTANYL-BUPIVACAINE-NACL 0.5-0.125-0.9 MG/250ML-% EP SOLN: 12.0000 mL/h | EPIDURAL | Status: DC | PRN

## 2019-07-15 MED ORDER — MEPERIDINE HCL 25 MG/ML IJ SOLN: 6.2500 mg | INTRAMUSCULAR | Status: DC | PRN

## 2019-07-15 MED ORDER — DEXAMETHASONE SODIUM PHOSPHATE 10 MG/ML IJ SOLN
INTRAMUSCULAR | Status: DC | PRN
  Administered 2019-07-15: 10 mg via INTRAVENOUS

## 2019-07-15 MED ORDER — NALOXONE HCL 4 MG/10ML IJ SOLN
1.0000 ug/kg/h | INTRAVENOUS | Status: DC | PRN
  Filled 2019-07-15: qty 5

## 2019-07-15 MED ORDER — PHENYLEPHRINE 40 MCG/ML (10ML) SYRINGE FOR IV PUSH (FOR BLOOD PRESSURE SUPPORT): 80.0000 ug | PREFILLED_SYRINGE | INTRAVENOUS | Status: DC | PRN

## 2019-07-15 MED ORDER — SODIUM CHLORIDE 0.9 % IR SOLN
Status: DC | PRN
  Administered 2019-07-15: 1000 mL

## 2019-07-15 MED ORDER — DEXAMETHASONE SODIUM PHOSPHATE 10 MG/ML IJ SOLN
INTRAMUSCULAR | Status: AC
  Filled 2019-07-15: qty 1

## 2019-07-15 MED ORDER — TERBUTALINE SULFATE 1 MG/ML IJ SOLN: 0.2500 mg | Freq: Once | INTRAMUSCULAR | Status: DC | PRN

## 2019-07-15 MED ORDER — LACTATED RINGERS IV SOLN: INTRAVENOUS | Status: DC

## 2019-07-15 MED ORDER — BUPIVACAINE IN DEXTROSE 0.75-8.25 % IT SOLN
INTRATHECAL | Status: DC | PRN
  Administered 2019-07-15: 1.5 mL via INTRATHECAL

## 2019-07-15 MED ORDER — LABETALOL HCL 100 MG PO TABS
100.0000 mg | ORAL_TABLET | Freq: Two times a day (BID) | ORAL | Status: DC
  Administered 2019-07-16 – 2019-07-17 (×4): 100 mg via ORAL
  Filled 2019-07-15 (×4): qty 1

## 2019-07-15 MED ORDER — SIMETHICONE 80 MG PO CHEW
80.0000 mg | CHEWABLE_TABLET | Freq: Three times a day (TID) | ORAL | Status: DC
  Administered 2019-07-16 – 2019-07-17 (×4): 80 mg via ORAL
  Filled 2019-07-15 (×4): qty 1

## 2019-07-15 MED ORDER — SCOPOLAMINE 1 MG/3DAYS TD PT72: 1.0000 | MEDICATED_PATCH | Freq: Once | TRANSDERMAL | Status: DC

## 2019-07-15 MED ORDER — OXYTOCIN 40 UNITS IN NORMAL SALINE INFUSION - SIMPLE MED
1.0000 m[IU]/min | INTRAVENOUS | Status: DC
  Administered 2019-07-15: 2 m[IU]/min via INTRAVENOUS

## 2019-07-15 MED ORDER — ZOLPIDEM TARTRATE 5 MG PO TABS: 5.0000 mg | ORAL_TABLET | Freq: Every evening | ORAL | Status: DC | PRN

## 2019-07-15 MED ORDER — LACTATED RINGERS IV SOLN: 500.0000 mL | INTRAVENOUS | Status: DC | PRN

## 2019-07-15 MED ORDER — NALBUPHINE HCL 10 MG/ML IJ SOLN: 5.0000 mg | Freq: Once | INTRAMUSCULAR | Status: DC | PRN

## 2019-07-15 MED ORDER — OXYTOCIN 40 UNITS IN NORMAL SALINE INFUSION - SIMPLE MED
INTRAVENOUS | Status: DC | PRN
  Administered 2019-07-15: 500 mL via INTRAVENOUS

## 2019-07-15 MED ORDER — SIMETHICONE 80 MG PO CHEW: 80.0000 mg | CHEWABLE_TABLET | ORAL | Status: DC | PRN

## 2019-07-15 MED ORDER — ACETAMINOPHEN 500 MG PO TABS
1000.0000 mg | ORAL_TABLET | Freq: Four times a day (QID) | ORAL | Status: AC
  Administered 2019-07-16 (×4): 1000 mg via ORAL
  Filled 2019-07-15 (×4): qty 2

## 2019-07-15 MED ORDER — OXYTOCIN 40 UNITS IN NORMAL SALINE INFUSION - SIMPLE MED
2.5000 [IU]/h | INTRAVENOUS | Status: DC
  Filled 2019-07-15: qty 1000

## 2019-07-15 MED ORDER — DIBUCAINE (PERIANAL) 1 % EX OINT: 1.0000 "application " | TOPICAL_OINTMENT | CUTANEOUS | Status: DC | PRN

## 2019-07-15 MED ORDER — EPHEDRINE 5 MG/ML INJ: 10.0000 mg | INTRAVENOUS | Status: DC | PRN

## 2019-07-15 MED ORDER — MORPHINE SULFATE (PF) 0.5 MG/ML IJ SOLN
INTRAMUSCULAR | Status: AC
  Filled 2019-07-15: qty 10

## 2019-07-15 MED ORDER — DEXTROSE 5 % IV SOLN: 3.0000 g | INTRAVENOUS | Status: DC

## 2019-07-15 MED ORDER — PENICILLIN G POT IN DEXTROSE 60000 UNIT/ML IV SOLN
3.0000 10*6.[IU] | INTRAVENOUS | Status: DC
  Administered 2019-07-15 (×4): 3 10*6.[IU] via INTRAVENOUS
  Filled 2019-07-15 (×4): qty 50

## 2019-07-15 MED ORDER — DIPHENHYDRAMINE HCL 25 MG PO CAPS: 25.0000 mg | ORAL_CAPSULE | ORAL | Status: DC | PRN

## 2019-07-15 MED ORDER — TETANUS-DIPHTH-ACELL PERTUSSIS 5-2.5-18.5 LF-MCG/0.5 IM SUSP: 0.5000 mL | Freq: Once | INTRAMUSCULAR | Status: DC

## 2019-07-15 MED ORDER — MISOPROSTOL 25 MCG QUARTER TABLET
25.0000 ug | ORAL_TABLET | ORAL | Status: DC | PRN
  Administered 2019-07-15 (×2): 25 ug via VAGINAL
  Filled 2019-07-15 (×2): qty 1

## 2019-07-15 MED ORDER — PHENYLEPHRINE HCL-NACL 20-0.9 MG/250ML-% IV SOLN
INTRAVENOUS | Status: AC
  Filled 2019-07-15: qty 250

## 2019-07-15 MED ORDER — WITCH HAZEL-GLYCERIN EX PADS: 1.0000 "application " | MEDICATED_PAD | CUTANEOUS | Status: DC | PRN

## 2019-07-15 MED ORDER — PHENYLEPHRINE HCL-NACL 20-0.9 MG/250ML-% IV SOLN
INTRAVENOUS | Status: DC | PRN
  Administered 2019-07-15: 60 ug/min via INTRAVENOUS

## 2019-07-15 MED ORDER — MORPHINE SULFATE (PF) 0.5 MG/ML IJ SOLN
INTRAMUSCULAR | Status: DC | PRN
  Administered 2019-07-15: .15 mg via INTRATHECAL

## 2019-07-15 MED ORDER — ONDANSETRON HCL 4 MG/2ML IJ SOLN
INTRAMUSCULAR | Status: AC
  Filled 2019-07-15: qty 2

## 2019-07-15 MED ORDER — HYDROMORPHONE HCL 1 MG/ML IJ SOLN: 0.2000 mg | INTRAMUSCULAR | Status: DC | PRN

## 2019-07-15 MED ORDER — OXYCODONE HCL 5 MG PO TABS: 5.0000 mg | ORAL_TABLET | Freq: Once | ORAL | Status: DC | PRN

## 2019-07-15 MED ORDER — SIMETHICONE 80 MG PO CHEW
80.0000 mg | CHEWABLE_TABLET | ORAL | Status: DC
  Administered 2019-07-16: 80 mg via ORAL
  Filled 2019-07-15: qty 1

## 2019-07-15 MED ORDER — KETOROLAC TROMETHAMINE 30 MG/ML IJ SOLN
30.0000 mg | Freq: Once | INTRAMUSCULAR | Status: AC | PRN
  Administered 2019-07-15: 30 mg via INTRAVENOUS

## 2019-07-15 MED ORDER — ACETAMINOPHEN 325 MG PO TABS
650.0000 mg | ORAL_TABLET | ORAL | Status: DC | PRN
  Administered 2019-07-16: 650 mg via ORAL
  Filled 2019-07-15: qty 2

## 2019-07-15 MED ORDER — ACETAMINOPHEN 325 MG PO TABS: 650.0000 mg | ORAL_TABLET | ORAL | Status: DC | PRN

## 2019-07-15 MED ORDER — HYDROXYZINE HCL 50 MG PO TABS: 50.0000 mg | ORAL_TABLET | Freq: Four times a day (QID) | ORAL | Status: DC | PRN

## 2019-07-15 MED ORDER — DEXTROSE 5 % IV SOLN
INTRAVENOUS | Status: AC
  Filled 2019-07-15: qty 3000

## 2019-07-15 MED ORDER — OXYTOCIN BOLUS FROM INFUSION: 500.0000 mL | Freq: Once | INTRAVENOUS | Status: DC

## 2019-07-15 MED ORDER — ONDANSETRON HCL 4 MG/2ML IJ SOLN: 4.0000 mg | Freq: Three times a day (TID) | INTRAMUSCULAR | Status: DC | PRN

## 2019-07-15 MED ORDER — ONDANSETRON HCL 4 MG/2ML IJ SOLN
INTRAMUSCULAR | Status: DC | PRN
  Administered 2019-07-15: 4 mg via INTRAVENOUS

## 2019-07-15 MED ORDER — SOD CITRATE-CITRIC ACID 500-334 MG/5ML PO SOLN
30.0000 mL | ORAL | Status: DC | PRN
  Administered 2019-07-15: 30 mL via ORAL
  Filled 2019-07-15: qty 30

## 2019-07-15 MED ORDER — LACTATED RINGERS IV SOLN: 500.0000 mL | Freq: Once | INTRAVENOUS | Status: DC

## 2019-07-15 MED ORDER — DIPHENHYDRAMINE HCL 25 MG PO CAPS: 25.0000 mg | ORAL_CAPSULE | Freq: Four times a day (QID) | ORAL | Status: DC | PRN

## 2019-07-15 MED ORDER — OXYCODONE-ACETAMINOPHEN 5-325 MG PO TABS: 2.0000 | ORAL_TABLET | ORAL | Status: DC | PRN

## 2019-07-15 MED ORDER — KETOROLAC TROMETHAMINE 30 MG/ML IJ SOLN
INTRAMUSCULAR | Status: AC
  Filled 2019-07-15: qty 1

## 2019-07-15 MED ORDER — NALOXONE HCL 0.4 MG/ML IJ SOLN: 0.4000 mg | INTRAMUSCULAR | Status: DC | PRN

## 2019-07-15 MED ORDER — FENTANYL CITRATE (PF) 100 MCG/2ML IJ SOLN
INTRAMUSCULAR | Status: DC | PRN
  Administered 2019-07-15: 15 ug via INTRATHECAL

## 2019-07-15 MED ORDER — FENTANYL CITRATE (PF) 100 MCG/2ML IJ SOLN
INTRAMUSCULAR | Status: AC
  Filled 2019-07-15: qty 2

## 2019-07-15 MED ORDER — OXYTOCIN 40 UNITS IN NORMAL SALINE INFUSION - SIMPLE MED: 2.5000 [IU]/h | INTRAVENOUS | Status: AC

## 2019-07-15 MED ORDER — SENNOSIDES-DOCUSATE SODIUM 8.6-50 MG PO TABS
2.0000 | ORAL_TABLET | ORAL | Status: DC
  Administered 2019-07-16: 2 via ORAL
  Filled 2019-07-15: qty 2

## 2019-07-15 MED ORDER — MENTHOL 3 MG MT LOZG: 1.0000 | LOZENGE | OROMUCOSAL | Status: DC | PRN

## 2019-07-15 MED ORDER — HYDROCODONE-ACETAMINOPHEN 5-325 MG PO TABS: 1.0000 | ORAL_TABLET | ORAL | Status: DC | PRN

## 2019-07-15 MED ORDER — ONDANSETRON HCL 4 MG/2ML IJ SOLN: 4.0000 mg | Freq: Four times a day (QID) | INTRAMUSCULAR | Status: DC | PRN

## 2019-07-15 SURGICAL SUPPLY — 36 items
ADH SKN CLS APL DERMABOND .7 (GAUZE/BANDAGES/DRESSINGS)
APL SKNCLS STERI-STRIP NONHPOA (GAUZE/BANDAGES/DRESSINGS) ×1
BENZOIN TINCTURE PRP APPL 2/3 (GAUZE/BANDAGES/DRESSINGS) ×2 IMPLANT
CHLORAPREP W/TINT 26ML (MISCELLANEOUS) ×2 IMPLANT
CLAMP CORD UMBIL (MISCELLANEOUS) IMPLANT
CLOSURE STERI STRIP 1/2 X4 (GAUZE/BANDAGES/DRESSINGS) ×2 IMPLANT
CLOTH BEACON ORANGE TIMEOUT ST (SAFETY) ×2 IMPLANT
DERMABOND ADVANCED (GAUZE/BANDAGES/DRESSINGS)
DERMABOND ADVANCED .7 DNX12 (GAUZE/BANDAGES/DRESSINGS) IMPLANT
DRSG OPSITE POSTOP 4X10 (GAUZE/BANDAGES/DRESSINGS) ×2 IMPLANT
ELECT REM PT RETURN 9FT ADLT (ELECTROSURGICAL) ×2
ELECTRODE REM PT RTRN 9FT ADLT (ELECTROSURGICAL) ×1 IMPLANT
EXTRACTOR VACUUM M CUP 4 TUBE (SUCTIONS) IMPLANT
GAUZE SPONGE 4X4 12PLY STRL LF (GAUZE/BANDAGES/DRESSINGS) ×2 IMPLANT
GLOVE BIO SURGEON STRL SZ7.5 (GLOVE) ×2 IMPLANT
GLOVE BIOGEL PI IND STRL 7.0 (GLOVE) ×1 IMPLANT
GLOVE BIOGEL PI INDICATOR 7.0 (GLOVE) ×1
GOWN STRL REUS W/TWL LRG LVL3 (GOWN DISPOSABLE) ×4 IMPLANT
KIT ABG SYR 3ML LUER SLIP (SYRINGE) ×2 IMPLANT
NEEDLE HYPO 25X5/8 SAFETYGLIDE (NEEDLE) ×2 IMPLANT
NS IRRIG 1000ML POUR BTL (IV SOLUTION) ×2 IMPLANT
PACK C SECTION WH (CUSTOM PROCEDURE TRAY) ×2 IMPLANT
PAD OB MATERNITY 4.3X12.25 (PERSONAL CARE ITEMS) ×2 IMPLANT
PENCIL SMOKE EVAC W/HOLSTER (ELECTROSURGICAL) ×2 IMPLANT
STRIP CLOSURE SKIN 1/2X4 (GAUZE/BANDAGES/DRESSINGS) IMPLANT
SUT MNCRL 0 VIOLET CTX 36 (SUTURE) ×4 IMPLANT
SUT MONOCRYL 0 CTX 36 (SUTURE) ×4
SUT PDS AB 0 CTX 60 (SUTURE) ×2 IMPLANT
SUT PLAIN 0 NONE (SUTURE) IMPLANT
SUT PLAIN 2 0 (SUTURE)
SUT PLAIN 2 0 XLH (SUTURE) IMPLANT
SUT PLAIN ABS 2-0 CT1 27XMFL (SUTURE) IMPLANT
SUT VIC AB 4-0 KS 27 (SUTURE) ×2 IMPLANT
TOWEL OR 17X24 6PK STRL BLUE (TOWEL DISPOSABLE) ×2 IMPLANT
TRAY FOLEY W/BAG SLVR 14FR LF (SET/KITS/TRAYS/PACK) ×2 IMPLANT
WATER STERILE IRR 1000ML POUR (IV SOLUTION) ×2 IMPLANT

## 2019-07-15 NOTE — Anesthesia Postprocedure Evaluation (Signed)
Anesthesia Post Note  Patient: Misty [name]  Procedure(s) Performed: CESAREAN SECTION (N/A )     Patient location during evaluation: PACU Anesthesia Type: Spinal Level of consciousness: oriented and awake and alert Pain management: pain level controlled Vital Signs Assessment: post-procedure vital signs reviewed and stable Respiratory status: spontaneous breathing and respiratory function stable Cardiovascular status: blood pressure returned to baseline and stable Postop Assessment: no headache, no backache, no apparent nausea or vomiting and patient able to bend at knees Anesthetic complications: no    Last Vitals:  Vitals:   07/15/19 2115 07/15/19 2131  BP: 128/60 130/74  Pulse: 87 80  Resp: 18 (!) 31  Temp: 36.6 C   SpO2: 100% 100%    Last Pain:  Vitals:   07/15/19 2131  TempSrc:   PainSc: 0-No pain   Pain Goal:                Epidural/Spinal Function Cutaneous sensation: Able to Wiggle Toes (07/15/19 2131), Patient able to flex knees: No (07/15/19 2131), Patient able to lift hips off bed: No (07/15/19 2131), Back pain beyond tenderness at insertion site: No (07/15/19 2131), Progressively worsening motor and/or sensory loss: No (07/15/19 2131), Bowel and/or bladder incontinence post epidural: No (07/15/19 2131)  Pervis Hocking

## 2019-07-15 NOTE — Progress Notes (Signed)
FHT cat one UCs MVU 210-220 Cx 3/60/-3  No change after 2.5-3 hours of good labor. No descent of vtx A/P: recommend cesarean section Risks D/W including infection, organ damage, bleeding/transfusion-HIV/Hep, DVT/PE, pneumonia. Patient states she understands and agrees.

## 2019-07-15 NOTE — Anesthesia Preprocedure Evaluation (Signed)
Anesthesia Evaluation  Patient identified by MRN, date of birth, ID band Patient awake    Reviewed: Allergy & Precautions, NPO status , Patient's Chart, lab work & pertinent test results, reviewed documented beta blocker date and time   Airway Mallampati: II  TM Distance: >3 FB Neck ROM: Full    Dental no notable dental hx.    Pulmonary asthma , former smoker,  COVID +   Pulmonary exam normal breath sounds clear to auscultation       Cardiovascular hypertension, Pt. on medications and Pt. on home beta blockers negative cardio ROS Normal cardiovascular exam Rhythm:Regular Rate:Normal     Neuro/Psych negative neurological ROS  negative psych ROS   GI/Hepatic negative GI ROS, Neg liver ROS,   Endo/Other  Morbid obesityBMI 44  Renal/GU negative Renal ROS  negative genitourinary   Musculoskeletal negative musculoskeletal ROS (+)   Abdominal (+) + obese,   Peds negative pediatric ROS (+)  Hematology  (+) Blood dyscrasia, anemia , Hct 31.2, plt 264   Anesthesia Other Findings   Reproductive/Obstetrics negative OB ROS                             Anesthesia Physical Anesthesia Plan  ASA: III  Anesthesia Plan: Spinal   Post-op Pain Management:    Induction:   PONV Risk Score and Plan: 2 and Ondansetron and Dexamethasone  Airway Management Planned: Natural Airway and Nasal Cannula  Additional Equipment: None  Intra-op Plan:   Post-operative Plan:   Informed Consent: I have reviewed the patients History and Physical, chart, labs and discussed the procedure including the risks, benefits and alternatives for the proposed anesthesia with the patient or authorized representative who has indicated his/her understanding and acceptance.       Plan Discussed with: CRNA  Anesthesia Plan Comments: (Failure to progress)        Anesthesia Quick Evaluation

## 2019-07-15 NOTE — Anesthesia Procedure Notes (Signed)
Spinal  Patient location during procedure: OR Start time: 07/15/2019 8:10 PM End time: 07/15/2019 8:15 PM Staffing Performed: anesthesiologist  Anesthesiologist: Pervis Hocking, DO Preanesthetic Checklist Completed: patient identified, IV checked, risks and benefits discussed, surgical consent, monitors and equipment checked, pre-op evaluation and timeout performed Spinal Block Patient position: sitting Prep: DuraPrep and site prepped and draped Patient monitoring: cardiac monitor, continuous pulse ox and blood pressure Approach: midline Location: L3-4 Injection technique: single-shot Needle Needle type: Pencan  Needle gauge: 24 G Needle length: 9 cm Assessment Sensory level: T6 Additional Notes Functioning IV was confirmed and monitors were applied. Sterile prep and drape, including hand hygiene and sterile gloves were used. The patient was positioned and the spine was prepped. The skin was anesthetized with lidocaine.  Free flow of clear CSF was obtained prior to injecting local anesthetic into the CSF.  The spinal needle aspirated freely following injection.  The needle was carefully withdrawn.  The patient tolerated the procedure well.

## 2019-07-15 NOTE — Brief Op Note (Signed)
07/15/2019  9:11 PM  PATIENT:  Misty [name]  29 y.o. female  PRE-OPERATIVE DIAGNOSIS:  primary C/S, arrest of dilation  POST-OPERATIVE DIAGNOSIS:  primary C/S, arrest of dilation  PROCEDURE:  Procedure(s): CESAREAN SECTION (N/A)  SURGEON:  Surgeon(s) and Role:    * Everlene Farrier, MD - Primary  PHYSICIAN ASSISTANT:   ASSISTANTS: none   ANESTHESIA:   spinal  EBL:  574   BLOOD ADMINISTERED:none  DRAINS: Urinary Catheter (Foley)   LOCAL MEDICATIONS USED:  NONE  SPECIMEN:  Source of Specimen:  placenta  DISPOSITION OF SPECIMEN:  PATHOLOGY  COUNTS:  YES  TOURNIQUET:  * No tourniquets in log *  DICTATION: .Other Dictation: Dictation Number [id number]  PLAN OF CARE: Admit to inpatient   PATIENT DISPOSITION:  PACU - hemodynamically stable.   Delay start of Pharmacological VTE agent (>24hrs) due to surgical blood loss or risk of bleeding: not applicable

## 2019-07-15 NOTE — Transfer of Care (Signed)
Immediate Anesthesia Transfer of Care Note  Patient: Misty [name]  Procedure(s) Performed: CESAREAN SECTION (N/A )  Patient Location: PACU  Anesthesia Type:Spinal  Level of Consciousness: awake, alert  and oriented  Airway & Oxygen Therapy: Patient Spontanous Breathing  Post-op Assessment: Report given to RN and Post -op Vital signs reviewed and stable  Post vital signs: Reviewed and stable  Last Vitals:  Vitals Value Taken Time  BP 128/60 07/15/19 2115  Temp 36.6 C 07/15/19 2115  Pulse 86 07/15/19 2118  Resp 20 07/15/19 2118  SpO2 100 % 07/15/19 2118  Vitals shown include unvalidated device data.  Last Pain:  Vitals:   07/15/19 2115  TempSrc: Oral  PainSc: 0-No pain         Complications: No apparent anesthesia complications

## 2019-07-15 NOTE — Progress Notes (Signed)
FHT cat one UC irregular Cx 2/40/-3/applied AROM > clear

## 2019-07-15 NOTE — Progress Notes (Signed)
Covid + No SOB, no cough, no chest pain, no body aches, no HA, no vision change  Today's Vitals   07/15/19 0501 07/15/19 0601 07/15/19 0701 07/15/19 0800  BP: 128/76 129/68 121/65 126/68  Pulse: 91 92 92 90  Resp: 18 18 18 16   Temp: 98.2 F (36.8 C)     TempSrc: Oral     SpO2:      Weight:      Height:      PainSc: Asleep Asleep Asleep Asleep   Body mass index is 44.18 kg/m.   Results for orders placed or performed during the hospital encounter of 07/15/19 (from the past 24 hour(s))  Type and screen [place]     Status: None   Collection Time: 07/15/19  1:21 AM  Result Value Ref Range   ABO/RH(D) B POS    Antibody Screen NEG    Sample Expiration      07/18/2019,2359 Performed at [facility], [place] [address], [place], [place] [address]   ABO/Rh     Status: None   Collection Time: 07/15/19  1:21 AM  Result Value Ref Range   ABO/RH(D)      B POS Performed at [facility] [address] [place], [place] [address]   CBC     Status: Abnormal   Collection Time: 07/15/19  1:37 AM  Result Value Ref Range   WBC 11.4 (H) 4.0 - 10.5 K/uL   RBC 3.45 (L) 3.87 - 5.11 MIL/uL   Hemoglobin 10.8 (L) 12.0 - 15.0 g/dL   HCT 31.2 (L) 36.0 - 46.0 %   MCV 90.4 80.0 - 100.0 fL   MCH 31.3 26.0 - 34.0 pg   MCHC 34.6 30.0 - 36.0 g/dL   RDW 13.9 11.5 - 15.5 %   Platelets 264 150 - 400 K/uL   nRBC 0.0 0.0 - 0.2 %  Comprehensive metabolic panel     Status: Abnormal   Collection Time: 07/15/19  1:37 AM  Result Value Ref Range   Sodium 136 135 - 145 mmol/L   Potassium 3.5 3.5 - 5.1 mmol/L   Chloride 105 98 - 111 mmol/L   CO2 21 (L) 22 - 32 mmol/L   Glucose, Bld 120 (H) 70 - 99 mg/dL   BUN 9 6 - 20 mg/dL   Creatinine, Ser 0.65 0.44 - 1.00 mg/dL   Calcium 9.8 8.9 - 10.3 mg/dL   Total Protein 6.0 (L) 6.5 - 8.1 g/dL   Albumin 2.8 (L) 3.5 - 5.0 g/dL   AST 15 15 - 41 U/L   ALT 15 0 - 44 U/L   Alkaline Phosphatase 96 38 - 126 U/L   Total Bilirubin 0.3 0.3 -  1.2 mg/dL   GFR calc non Af Amer >60 >60 mL/min   GFR calc Af Amer >60 >60 mL/min   Anion gap 10 5 - 15    FHT cat one UCs irregular, mild Cx 2/60/-3/ballotable/vtx Last Cytotec about 5:30 am  A/P: D/W patient         Will start pitocin

## 2019-07-16 ENCOUNTER — Encounter (HOSPITAL_COMMUNITY): Payer: Self-pay | Admitting: Obstetrics and Gynecology

## 2019-07-16 LAB — CBC
HCT: 28.8 % — ABNORMAL LOW (ref 36.0–46.0)
Hemoglobin: 9.8 g/dL — ABNORMAL LOW (ref 12.0–15.0)
MCH: 30.9 pg (ref 26.0–34.0)
MCHC: 34 g/dL (ref 30.0–36.0)
MCV: 90.9 fL (ref 80.0–100.0)
Platelets: 239 10*3/uL (ref 150–400)
RBC: 3.17 MIL/uL — ABNORMAL LOW (ref 3.87–5.11)
RDW: 13.5 % (ref 11.5–15.5)
WBC: 14 10*3/uL — ABNORMAL HIGH (ref 4.0–10.5)
nRBC: 0 % (ref 0.0–0.2)

## 2019-07-16 NOTE — Progress Notes (Signed)
Subjective: Postpartum Day 1: Cesarean Delivery Patient reports tolerating PO, + flatus and no problems voiding.  No HA, vision change, RUQ pain, CP/SOB.  Objective: Vital signs in last 24 hours: Temp:  [97.9 F (36.6 C)-98.9 F (37.2 C)] 98.9 F (37.2 C) (01/22 0900) Pulse Rate:  [72-96] 81 (01/22 0500) Resp:  [16-31] 18 (01/22 0900) BP: (102-146)/(51-97) 132/78 (01/22 0500) SpO2:  [100 %] 100 % (01/22 0900)  Physical Exam:  General: alert, cooperative and appears stated age 29: appropriate Uterine Fundus: firm Incision: healing well, no significant drainage, no dehiscence DVT Evaluation: No evidence of DVT seen on physical exam. Negative Homan's sign. No cords or calf tenderness.  Recent Labs    07/15/19 0137 07/16/19 0555  HGB 10.8* 9.8*  HCT 31.2* 28.8*    Assessment/Plan: Status post Cesarean section. Doing well postoperatively.  COVID pos-asymptomatic Continue current care.  Linda Hedges 07/16/2019, 10:24 AM

## 2019-07-16 NOTE — Op Note (Signed)
[name], [name] MEDICAL RECORD [id number] ACCOUNT [id number] DATE OF BIRTH:1991-02-28 FACILITY: [facility] LOCATION: [place], [place]  OPERATIVE REPORT  DATE OF PROCEDURE:  07/15/2019  PREOPERATIVE DIAGNOSIS:  Arrest of dilation.  POSTOPERATIVE DIAGNOSIS:  Arrest of dilation.  PROCEDURE:  Primary low transverse cesarean section.  SURGEON:  Everlene Farrier, MD   ANESTHESIA:  Spinal.  ESTIMATED BLOOD LOSS:  574 mL.  SPECIMENS:  Placenta to pathology.  FINDINGS:  Viable female infant.  Apgars, arterial cord pH, birth weight, pending.  INDICATIONS AND CONSENT:  This patient is a 29 year old G1 P0 at 38-0/7 weeks, admitted for 2-stage induction of labor for gestational hypertension.  She received Cytotec and then Pitocin.  Artificial rupture of membranes for clear fluid is carried out.   She progresses to 3 cm dilation, 60% effacement, -3 station.  After at least 2-1/2 to 3 hours of excellent labor documented with IUPC, there is no descent and no progression of dilation.  Recommendation for cesarean section was made.  Potential risks  and complications are discussed preoperatively including but not limited to infection, organ damage, bleeding requiring transfusion of blood products with HIV and hepatitis acquisition, DVT, PE, pneumonia.  The patient states she understands and agrees  and consent was signed on the chart.  DESCRIPTION OF PROCEDURE:  The patient was taken to the operating room where she was identified and spinal anesthetic was placed per anesthesiology.  She was placed in the dorsal supine position with a 15-degree left lateral wedge.  She was prepped  vaginally with Betadine.  Foley catheter was placed and she was prepped with ChloraPrep.  After 3 minute drying time, she was draped in a sterile fashion.  Timeout has been undertaken.  After testing for adequate spinal anesthesia, skin was entered  through a Pfannenstiel incision and dissection was  carried out in layers to the peritoneum.  Peritoneum was extended superiorly and inferiorly.  Vesicouterine peritoneum was taken down cephalad laterally.  Bladder flap developed.  The bladder blade was  placed.  Uterus was incised in a low transverse manner.  The uterine cavity was entered bluntly with a hemostat.  The uterine incision was extended bilaterally with the fingers.  Vertex was then delivered without difficulty.  The remainder of the baby  was delivered.  Good cry and tone is noted.  After 1 minute, the cord was clamped and cut and the baby was handed to waiting pediatrics team.  Placenta was manually delivered and sent to pathology.  Uterine cavity was clean.  Uterus was closed in 2  running locking imbricating layers of 0 Monocryl suture, which achieved good hemostasis.  There are multiple subserosal fibroids noted ranging from 1-3 cm that are seen.  Copious lavage was carried out.  Anterior peritoneum was closed in a running  fashion with 0 Monocryl suture, which was also used to reapproximate the pyramidalis muscle in the midline.  Anterior rectus fascia was closed in a running fashion with a 0 looped PDS.  Subcutaneous layer was closed with interrupted 2-0 plain suture and  the skin was closed in a subcuticular fashion with 4-0 Vicryl on a Keith needle.  Benzoin, Steri-Strips, honeycomb, and pressure dressing are applied.  All counts were correct and the patient was taken to the recovery room in stable condition.  CN/NUANCE  D:07/15/2019 T:07/16/2019 JOB:[id number]

## 2019-07-16 NOTE — Lactation Note (Signed)
This note was copied from a baby's chart. Lactation Consultation Note  Patient Name: Misty [name]'Q Date: 07/16/2019 Reason for consult: Follow-up assessment;Difficult latch;Early term 37-38.6wks   P1 mom having a great deal of difficulty latching infant.  Mom has very large, fleshy, pendulous breast tissue, so supporting "target" during latch is difficult.  Nipples are flat and after pre pumping with hand pump, would slightly evert, but when sandwiched flattened again.  LC noted infant would tongue suck and tongue thrust when attempting to latch.  Mom is able to hand express drops; seen for the first time today.  Mom was excited.  Mom's goal is to bf.  LC discussed with family importance of positioning, good head and neck support, and what a good latch looks like.  After multiple unsuccessful attempts in different positions, LC used hand pump with DEBP kit to help bring out the nipple.  When the infant was still unable to latch, LC provided #24 NS and taught mom and dad how to apply.  Infant latched only to the tip of NS.  [facility] taught dad how to roll two burp cloths in order to better support breast tissue.  Then how to assist with support breast and baby during latch.  Infant did latch deeply after multiple attempts.  Good rhythmic sucking noted, no swallows heard.  NS not visible during feed.  Mom and dad very excited to see infant feeing.  Dad and mom worked to massage and use breast compression in order to stimulate infant to keep sucking.  Mom has no discomfort throughout feed and feels a strong tug.     Infant has a tight gape and has difficulty flanging lips so dad is aware of how to help this but gently moving chin downward after feed.  Infant fed for 15 minutes then fell asleep.  Body relaxed.  No colostrum was seen in NS after feeding; nipple everted out into shield after feeding.    LC reviewed DEBP use, set up, cleaning, and milk storage.  24 flange fit well.  Mom began  pumping immediately after feed.  LC strongly encouraged mom to feed on demand and to use DEBP after each feeding and to hand express and feed back colostrum to baby.  Containers and spoon provided.  Family understands what an appropriate latch looks like and to get positioned for best latch.  Mom denies questions at this time.  LC encouraged family to call out for assistance with bf.    Maternal Data Has patient been taught Hand Expression?: Yes Does the patient have breastfeeding experience prior to this delivery?: No  Feeding Feeding Type: Breast Fed  LATCH Score Latch: Repeated attempts needed to sustain latch, nipple held in mouth throughout feeding, stimulation needed to elicit sucking reflex.  Audible Swallowing: None  Type of Nipple: Flat(flattens when compressed/ everted only after stimulation)  Comfort (Breast/Nipple): Soft / non-tender  Hold (Positioning): Assistance needed to correctly position infant at breast and maintain latch.  LATCH Score: 5  Interventions Interventions: Breast feeding basics reviewed;Assisted with latch;Skin to skin;Breast massage;Hand express;Pre-pump if needed;Position options;DEBP;Support pillows;Hand pump;Adjust position;Breast compression  Lactation Tools Discussed/Used Tools: Nipple Shields Nipple shield size: 24 Pump Review: Setup, frequency, and cleaning;Milk Storage Initiated by:: Rhea Bleacher Date initiated:: 07/17/19   Consult Status Consult Status: Follow-up Date: 07/17/19 Follow-up type: In-patient    Ferne Coe [facility] 07/16/2019, 5:40 PM

## 2019-07-16 NOTE — Lactation Note (Signed)
This note was copied from a baby's chart. Lactation Consultation Note Baby 40 hrs old. [place] setting up pillows discussing positions and support. Baby having mucous emesis.  LC used suction bulb to clear mouth. LC discussed w/parents baby being spitty, noted congestion in back and nose. Reported to RN. Encouraged to keep baby upright.discussing sign of what baby is doing, what is choking, what is cueing or yawning. Newborn behavior briefly discussed, STS, I&O, breast massage, supply and demand. Mom demonstrated hand expression w/colostrum noted. Mom is [employer] Employee needs to get DEBP before d/c home. Main focus while LC in rm. Was baby choking. RN came into rm. When [place] leaving. Lactation brochure given. Encouraged mom to call for assistance or questions.  Patient Name: Misty [name] Today's Date: 07/16/2019 Reason for consult: Initial assessment;[place];Early term 37-38.6wks   Maternal Data Has patient been taught Hand Expression?: Yes Does the patient have breastfeeding experience prior to this delivery?: No  Feeding    LATCH Score Latch: Too sleepy or reluctant, no latch achieved, no sucking elicited.     Type of Nipple: Everted at rest and after stimulation  Comfort (Breast/Nipple): Soft / non-tender        Interventions Interventions: Breast feeding basics reviewed;Support pillows;Position options;Skin to skin;Breast massage;Hand express;Breast compression;Adjust position  Lactation Tools Discussed/Used WIC Program: No   Consult Status Consult Status: Follow-up Date: 07/16/19(in pm) Follow-up type: In-patient    Theodoro Kalata 07/16/2019, 4:55 AM

## 2019-07-17 MED ORDER — HYDROCODONE-ACETAMINOPHEN 5-325 MG PO TABS: 1.0000 | ORAL_TABLET | ORAL | 0 refills | Status: DC | PRN

## 2019-07-17 MED ORDER — IBUPROFEN 600 MG PO TABS: 600.0000 mg | ORAL_TABLET | Freq: Four times a day (QID) | ORAL | 0 refills | Status: DC | PRN

## 2019-07-17 NOTE — Discharge Summary (Signed)
Obstetric Discharge Summary Reason for Admission: induction of labor for GHTN; COVID positive (patient asymptomatic) Prenatal Procedures: none Intrapartum Procedures: cesarean: low cervical, transverse for arrest of dilation Postpartum Procedures: none Complications-Operative and Postpartum: none Hemoglobin  Date Value Ref Range Status  07/16/2019 9.8 (L) 12.0 - 15.0 g/dL Final   HCT  Date Value Ref Range Status  07/16/2019 28.8 (L) 36.0 - 46.0 % Final    Physical Exam:  General: alert, cooperative and appears stated age 29: appropriate Uterine Fundus: firm Incision: healing well, no significant drainage, no dehiscence DVT Evaluation: No evidence of DVT seen on physical exam. Negative Homan's sign. No cords or calf tenderness.  Discharge Diagnoses: Term Pregnancy-delivered and Gestational hypertension, COVID positive  Discharge Information: Date: 07/17/2019 Activity: pelvic rest Diet: routine Medications: PNV, Ibuprofen, Percocet and labetalol Condition: stable Instructions: refer to practice specific booklet Discharge to: home   Newborn Data: Live born female  Birth Weight: 8 lb 11 oz (3941 g) APGAR: 8, 9  Newborn Delivery   Birth date/time: 07/15/2019 20:29:00 Delivery type: C-Section, Low Transverse Trial of labor: Yes C-section categorization: Primary      Home with mother.  Linda Hedges 07/17/2019, 11:32 AM

## 2019-07-17 NOTE — Lactation Note (Addendum)
This note was copied from a baby's chart. Lactation Consultation Note  Patient Name: Misty [name] [id number] Date: 07/17/2019 Reason for consult: Follow-up assessment;1st time breastfeeding;Early term 37-38.6wks P1, 88 hour female infant, infant with 7% weight loss. 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  Parents will start supplementing infant with donor breast milk after mom latches infant at breast. Per mom, infant is starting to latch now with 24 mm NS. Per mom, she is starting to see colostrum in NS after infant latches from breast.  Mom knows how to hand express she did hand expression earlier and gave infant small amount by spoon. Mom will continue to use DEBP every 3 hours for fifteen minutes on initial setting. RN assisted mom with latch when LC entered the room, LC saw a few swallows and reinforced mom infant's nose should touch breast and hold infant firmly so that she doesn't have shallow latch. Infant was still breastfeeding when [facility] left the room. Mom's current feeding plan: 1. Mom will latch infant at breast with 24 mm NS breastfeed according infant feeding cues, 8 to 12 times within 24 hours and not exceed 3 hours without breastfeeding infant. 2. Mom will continue to use DEBP and hand express and give infant any EBM mixed with donor milk according infant's age/ hours of life. 3. Mom will call RN or LC if she needs assistance with latching infant at breast.    Maternal Data    Feeding Feeding Type: Breast Fed  LATCH Score Latch: Grasps breast easily, tongue down, lips flanged, rhythmical sucking.  Audible Swallowing: A few with stimulation  Type of Nipple: Flat  Comfort (Breast/Nipple): Soft / non-tender  Hold (Positioning): Assistance needed to correctly position infant at breast and maintain latch.  LATCH Score: 7  Interventions Interventions: Assisted with latch;Breast compression  Lactation Tools  Discussed/Used Nipple shield size: 24   Consult Status Consult Status: Follow-up Date: 07/17/19 Follow-up type: In-patient    Misty [name] 07/17/2019, 6:12 AM

## 2019-07-17 NOTE — Discharge Instructions (Signed)
Call MD for T>100.4, heavy vaginal bleeding, severe abdominal pain, or respiratory distress.  Call office to schedule postpartum visit in 6 weeks.  Pelvic rest x 6 weeks.  No driving while taking narcotics. 

## 2019-07-17 NOTE — Plan of Care (Signed)
Patient appropriate for discharge.

## 2019-07-17 NOTE — Progress Notes (Signed)
Subjective: Postpartum Day 2: Cesarean Delivery; COVID positive Patient reports tolerating PO, + flatus and no problems voiding.  No CP/SOB, RUQ pain, cough, HA, or visual disturbance.  Requests D/C.  Objective: Vital signs in last 24 hours: Temp:  [98.6 F (37 C)-99.3 F (37.4 C)] 98.6 F (37 C) (01/23 0535) Pulse Rate:  [73-90] 73 (01/23 0535) Resp:  [16] 16 (01/23 0535) BP: (115-127)/(50-75) 126/68 (01/23 0535) SpO2:  [100 %] 100 % (01/23 0535)  Physical Exam:  General: alert, cooperative and appears stated age Lochia: appropriate Uterine Fundus: firm Incision: healing well, no significant drainage, no dehiscence DVT Evaluation: No evidence of DVT seen on physical exam. Negative Homan's sign. No cords or calf tenderness.  Recent Labs    07/15/19 0137 07/16/19 0555  HGB 10.8* 9.8*  HCT 31.2* 28.8*    Assessment/Plan: Status post Cesarean section. Doing well postoperatively.  Discharge home with standard precautions and return to clinic in 4-6 weeks.  Linda Hedges 07/17/2019, 11:26 AM

## 2019-07-20 LAB — SURGICAL PATHOLOGY

## 2019-08-30 DIAGNOSIS — Z1389 Encounter for screening for other disorder: Secondary | ICD-10-CM | POA: Diagnosis not present

## 2019-08-30 DIAGNOSIS — Z01419 Encounter for gynecological examination (general) (routine) without abnormal findings: Secondary | ICD-10-CM | POA: Diagnosis not present

## 2019-11-23 DIAGNOSIS — N920 Excessive and frequent menstruation with regular cycle: Secondary | ICD-10-CM | POA: Diagnosis not present

## 2019-11-23 DIAGNOSIS — N939 Abnormal uterine and vaginal bleeding, unspecified: Secondary | ICD-10-CM | POA: Diagnosis not present

## 2019-11-23 DIAGNOSIS — Z3202 Encounter for pregnancy test, result negative: Secondary | ICD-10-CM | POA: Diagnosis not present

## 2019-11-23 DIAGNOSIS — D259 Leiomyoma of uterus, unspecified: Secondary | ICD-10-CM | POA: Diagnosis not present

## 2020-01-05 DIAGNOSIS — N920 Excessive and frequent menstruation with regular cycle: Secondary | ICD-10-CM | POA: Diagnosis not present

## 2020-02-16 DIAGNOSIS — R0981 Nasal congestion: Secondary | ICD-10-CM | POA: Diagnosis not present

## 2020-02-16 DIAGNOSIS — R05 Cough: Secondary | ICD-10-CM | POA: Diagnosis not present

## 2020-02-17 DIAGNOSIS — R0981 Nasal congestion: Secondary | ICD-10-CM | POA: Diagnosis not present

## 2020-02-17 DIAGNOSIS — R05 Cough: Secondary | ICD-10-CM | POA: Diagnosis not present

## 2020-02-22 DIAGNOSIS — N939 Abnormal uterine and vaginal bleeding, unspecified: Secondary | ICD-10-CM | POA: Diagnosis not present

## 2020-02-22 DIAGNOSIS — N920 Excessive and frequent menstruation with regular cycle: Secondary | ICD-10-CM | POA: Diagnosis not present

## 2020-02-29 DIAGNOSIS — N939 Abnormal uterine and vaginal bleeding, unspecified: Secondary | ICD-10-CM | POA: Diagnosis not present

## 2020-02-29 DIAGNOSIS — E039 Hypothyroidism, unspecified: Secondary | ICD-10-CM | POA: Diagnosis not present

## 2020-03-01 DIAGNOSIS — E039 Hypothyroidism, unspecified: Secondary | ICD-10-CM | POA: Diagnosis not present

## 2020-04-10 DIAGNOSIS — O24419 Gestational diabetes mellitus in pregnancy, unspecified control: Secondary | ICD-10-CM | POA: Diagnosis not present

## 2020-04-10 DIAGNOSIS — E039 Hypothyroidism, unspecified: Secondary | ICD-10-CM | POA: Diagnosis not present

## 2020-04-10 DIAGNOSIS — E559 Vitamin D deficiency, unspecified: Secondary | ICD-10-CM | POA: Diagnosis not present

## 2020-04-17 DIAGNOSIS — R030 Elevated blood-pressure reading, without diagnosis of hypertension: Secondary | ICD-10-CM | POA: Diagnosis not present

## 2020-04-17 DIAGNOSIS — O24419 Gestational diabetes mellitus in pregnancy, unspecified control: Secondary | ICD-10-CM | POA: Diagnosis not present

## 2020-04-17 DIAGNOSIS — E039 Hypothyroidism, unspecified: Secondary | ICD-10-CM | POA: Diagnosis not present

## 2020-04-17 DIAGNOSIS — E559 Vitamin D deficiency, unspecified: Secondary | ICD-10-CM | POA: Diagnosis not present

## 2020-05-24 DIAGNOSIS — E039 Hypothyroidism, unspecified: Secondary | ICD-10-CM | POA: Diagnosis not present

## 2020-05-29 DIAGNOSIS — E559 Vitamin D deficiency, unspecified: Secondary | ICD-10-CM | POA: Diagnosis not present

## 2020-05-29 DIAGNOSIS — O24419 Gestational diabetes mellitus in pregnancy, unspecified control: Secondary | ICD-10-CM | POA: Diagnosis not present

## 2020-05-29 DIAGNOSIS — R030 Elevated blood-pressure reading, without diagnosis of hypertension: Secondary | ICD-10-CM | POA: Diagnosis not present

## 2020-05-29 DIAGNOSIS — E039 Hypothyroidism, unspecified: Secondary | ICD-10-CM | POA: Diagnosis not present

## 2020-07-26 DIAGNOSIS — E063 Autoimmune thyroiditis: Secondary | ICD-10-CM | POA: Diagnosis not present

## 2020-07-26 DIAGNOSIS — E039 Hypothyroidism, unspecified: Secondary | ICD-10-CM | POA: Diagnosis not present

## 2020-07-26 DIAGNOSIS — E559 Vitamin D deficiency, unspecified: Secondary | ICD-10-CM | POA: Diagnosis not present

## 2020-07-26 DIAGNOSIS — R030 Elevated blood-pressure reading, without diagnosis of hypertension: Secondary | ICD-10-CM | POA: Diagnosis not present

## 2020-07-26 DIAGNOSIS — R110 Nausea: Secondary | ICD-10-CM | POA: Diagnosis not present

## 2020-07-31 DIAGNOSIS — O24419 Gestational diabetes mellitus in pregnancy, unspecified control: Secondary | ICD-10-CM | POA: Diagnosis not present

## 2020-07-31 DIAGNOSIS — R030 Elevated blood-pressure reading, without diagnosis of hypertension: Secondary | ICD-10-CM | POA: Diagnosis not present

## 2020-07-31 DIAGNOSIS — E039 Hypothyroidism, unspecified: Secondary | ICD-10-CM | POA: Diagnosis not present

## 2020-07-31 DIAGNOSIS — E559 Vitamin D deficiency, unspecified: Secondary | ICD-10-CM | POA: Diagnosis not present

## 2020-09-06 DIAGNOSIS — E0580 Other thyrotoxicosis without thyrotoxic crisis or storm: Secondary | ICD-10-CM | POA: Insufficient documentation

## 2020-09-06 DIAGNOSIS — N939 Abnormal uterine and vaginal bleeding, unspecified: Secondary | ICD-10-CM | POA: Diagnosis not present

## 2020-10-02 DIAGNOSIS — N912 Amenorrhea, unspecified: Secondary | ICD-10-CM | POA: Diagnosis not present

## 2021-01-23 DIAGNOSIS — E039 Hypothyroidism, unspecified: Secondary | ICD-10-CM | POA: Diagnosis not present

## 2021-01-30 DIAGNOSIS — E559 Vitamin D deficiency, unspecified: Secondary | ICD-10-CM | POA: Diagnosis not present

## 2021-01-30 DIAGNOSIS — E039 Hypothyroidism, unspecified: Secondary | ICD-10-CM | POA: Diagnosis not present

## 2021-01-30 DIAGNOSIS — R030 Elevated blood-pressure reading, without diagnosis of hypertension: Secondary | ICD-10-CM | POA: Diagnosis not present

## 2021-01-30 DIAGNOSIS — O24419 Gestational diabetes mellitus in pregnancy, unspecified control: Secondary | ICD-10-CM | POA: Diagnosis not present

## 2021-04-04 DIAGNOSIS — Z124 Encounter for screening for malignant neoplasm of cervix: Secondary | ICD-10-CM | POA: Diagnosis not present

## 2021-04-04 DIAGNOSIS — Z6838 Body mass index (BMI) 38.0-38.9, adult: Secondary | ICD-10-CM | POA: Diagnosis not present

## 2021-04-04 DIAGNOSIS — Z01419 Encounter for gynecological examination (general) (routine) without abnormal findings: Secondary | ICD-10-CM | POA: Diagnosis not present

## 2021-04-04 DIAGNOSIS — Z3202 Encounter for pregnancy test, result negative: Secondary | ICD-10-CM | POA: Diagnosis not present

## 2021-05-14 DIAGNOSIS — J09X2 Influenza due to identified novel influenza A virus with other respiratory manifestations: Secondary | ICD-10-CM | POA: Diagnosis not present

## 2021-05-14 DIAGNOSIS — R030 Elevated blood-pressure reading, without diagnosis of hypertension: Secondary | ICD-10-CM | POA: Diagnosis not present

## 2021-05-14 DIAGNOSIS — Z20828 Contact with and (suspected) exposure to other viral communicable diseases: Secondary | ICD-10-CM | POA: Diagnosis not present

## 2021-05-14 DIAGNOSIS — F419 Anxiety disorder, unspecified: Secondary | ICD-10-CM | POA: Insufficient documentation

## 2021-05-14 DIAGNOSIS — J45909 Unspecified asthma, uncomplicated: Secondary | ICD-10-CM | POA: Insufficient documentation

## 2021-06-19 DIAGNOSIS — R062 Wheezing: Secondary | ICD-10-CM | POA: Diagnosis not present

## 2021-06-19 DIAGNOSIS — J0100 Acute maxillary sinusitis, unspecified: Secondary | ICD-10-CM | POA: Diagnosis not present

## 2021-07-30 DIAGNOSIS — R1013 Epigastric pain: Secondary | ICD-10-CM | POA: Diagnosis not present

## 2021-07-30 DIAGNOSIS — R509 Fever, unspecified: Secondary | ICD-10-CM | POA: Diagnosis not present

## 2021-07-30 DIAGNOSIS — B349 Viral infection, unspecified: Secondary | ICD-10-CM | POA: Diagnosis not present

## 2021-09-05 DIAGNOSIS — E039 Hypothyroidism, unspecified: Secondary | ICD-10-CM | POA: Diagnosis not present

## 2021-09-05 DIAGNOSIS — E063 Autoimmune thyroiditis: Secondary | ICD-10-CM | POA: Diagnosis not present

## 2021-09-27 DIAGNOSIS — E039 Hypothyroidism, unspecified: Secondary | ICD-10-CM | POA: Diagnosis not present

## 2021-09-27 DIAGNOSIS — Z8632 Personal history of gestational diabetes: Secondary | ICD-10-CM | POA: Diagnosis not present

## 2021-09-27 DIAGNOSIS — R030 Elevated blood-pressure reading, without diagnosis of hypertension: Secondary | ICD-10-CM | POA: Diagnosis not present

## 2021-09-27 DIAGNOSIS — E063 Autoimmune thyroiditis: Secondary | ICD-10-CM | POA: Diagnosis not present

## 2021-10-27 ENCOUNTER — Emergency Department (HOSPITAL_BASED_OUTPATIENT_CLINIC_OR_DEPARTMENT_OTHER): Payer: BC Managed Care – PPO | Admitting: Radiology

## 2021-10-27 ENCOUNTER — Other Ambulatory Visit: Payer: Self-pay

## 2021-10-27 ENCOUNTER — Emergency Department (HOSPITAL_BASED_OUTPATIENT_CLINIC_OR_DEPARTMENT_OTHER)
Admission: EM | Admit: 2021-10-27 | Discharge: 2021-10-27 | Disposition: A | Discharge status: Home / Self Care | Payer: BC Managed Care – PPO | Attending: Emergency Medicine | Admitting: Emergency Medicine

## 2021-10-27 DIAGNOSIS — R079 Chest pain, unspecified: Secondary | ICD-10-CM | POA: Diagnosis not present

## 2021-10-27 DIAGNOSIS — J40 Bronchitis, not specified as acute or chronic: Secondary | ICD-10-CM | POA: Insufficient documentation

## 2021-10-27 DIAGNOSIS — R059 Cough, unspecified: Secondary | ICD-10-CM | POA: Diagnosis not present

## 2021-10-27 DIAGNOSIS — J45909 Unspecified asthma, uncomplicated: Secondary | ICD-10-CM | POA: Diagnosis not present

## 2021-10-27 DIAGNOSIS — R0789 Other chest pain: Secondary | ICD-10-CM | POA: Diagnosis not present

## 2021-10-27 MED ORDER — IBUPROFEN 800 MG PO TABS: 800.0000 mg | ORAL_TABLET | Freq: Three times a day (TID) | ORAL | 0 refills | Status: DC | PRN

## 2021-10-27 MED ORDER — BENZONATATE 100 MG PO CAPS: 100.0000 mg | ORAL_CAPSULE | Freq: Three times a day (TID) | ORAL | 0 refills | Status: DC | PRN

## 2021-10-27 MED ORDER — AZITHROMYCIN 250 MG PO TABS: 250.0000 mg | ORAL_TABLET | Freq: Every day | ORAL | 0 refills | Status: DC

## 2021-10-27 NOTE — ED Provider Notes (Signed)
? ?Emergency Department Provider Note ? ? ?I have reviewed the triage vital signs and the nursing notes. ? ? ?HISTORY ? ?Chief Complaint ?Rib pain ? ? ?HPI ?Honest Safranek is a 31 y.o. female  presents to the ED with right rib pain in the setting of forceful coughing. No hemoptysis. No vomiting or diarrhea. Cough has been ongoing for the last 6 months. No fever. Notes a history of asthma but does not often use MDI. No abdominal pain. Pain occurred suddenly after coughing.  ? ? ?Past Medical History:  ?Diagnosis Date  ? Anemia   ? Asthma   ? Pregnancy induced hypertension   ? ? ?Review of Systems ? ?Constitutional: No fever/chills ?Eyes: No visual changes. ?ENT: No sore throat. ?Cardiovascular: Positive chest pain. ?Respiratory: Denies shortness of breath. Positive cough.  ?Gastrointestinal: No abdominal pain.  No nausea, no vomiting.  No diarrhea.  No constipation. ?Genitourinary: Negative for dysuria. ?Musculoskeletal: Negative for back pain. ?Skin: Negative for rash. ?Neurological: Negative for headaches, focal weakness or numbness. ? ? ?____________________________________________ ? ? ?PHYSICAL EXAM: ? ?VITAL SIGNS: ?ED Triage Vitals  ?Enc Vitals Group  ?   BP 10/27/21 0959 (!) 159/91  ?   Pulse Rate 10/27/21 0959 89  ?   Resp 10/27/21 0959 18  ?   Temp 10/27/21 0959 98.9 ?F (37.2 ?C)  ?   Temp Source 10/27/21 0959 Oral  ?   SpO2 10/27/21 0959 100 %  ?   Weight 10/27/21 0958 240 lb (108.9 kg)  ?   Height 10/27/21 0958 '5\' 5"'$  (1.651 m)  ? ?Constitutional: Alert and oriented. Well appearing and in no acute distress. ?Eyes: Conjunctivae are normal.  ?Head: Atraumatic. ?Nose: No congestion/rhinnorhea. ?Mouth/Throat: Mucous membranes are moist.   ?Neck: No stridor.   ?Cardiovascular: Normal rate, regular rhythm. Good peripheral circulation. Grossly normal heart sounds.   ?Respiratory: Normal respiratory effort.  No retractions. Lungs CTAB. ?Gastrointestinal: Soft and nontender. No distention.  ?Musculoskeletal: No  lower extremity tenderness nor edema. No gross deformities of extremities. Positive tenderness to the right lateral chest wall that reproduces the patient's pain . ?Neurologic:  Normal speech and language. No gross focal neurologic deficits are appreciated.  ?Skin:  Skin is warm, dry and intact. No rash noted. ? ?____________________________________________ ? ?RADIOLOGY ? ?DG Chest 2 View ? ?Result Date: 10/27/2021 ?CLINICAL DATA:  Chest pain.  Cough over the last 6 months. EXAM: CHEST - 2 VIEW COMPARISON:  None FINDINGS: Heart size upper limits of normal. Mediastinal shadows are normal. There is central bronchial thickening but no infiltrate, collapse or effusion. No abnormal bone finding. IMPRESSION: Possible bronchitis.  No consolidation or collapse. Electronically Signed   By: Nelson Chimes M.D.   On: 10/27/2021 11:35   ? ?____________________________________________ ? ? ?PROCEDURES ? ?Procedure(s) performed:  ? ?Procedures ? ?None ?____________________________________________ ? ? ?INITIAL IMPRESSION / ASSESSMENT AND PLAN / ED COURSE ? ?Pertinent labs & imaging results that were available during my care of the patient were reviewed by me and considered in my medical decision making (see chart for details). ?  ? ?Radiologic Tests Ordered, included CXR. I independently interpreted the images and agree with radiology interpretation.  ? ?Medical Decision Making: Summary:  ?Patient with reproducible chest wall pain. No findings on CXR. No hypoxemia. No pneumothorax. Doubt PE clinically although considered. Will treat bronchitis and MDI. Plan for close PCP follow up.  ? ? ?Disposition: discharge ? ?____________________________________________ ? ?FINAL CLINICAL IMPRESSION(S) / ED DIAGNOSES ? ?Final diagnoses:  ?Chest wall  pain  ?Bronchitis  ? ? ? ?NEW OUTPATIENT MEDICATIONS STARTED DURING THIS VISIT: ? ?Discharge Medication List as of 10/27/2021 12:26 PM  ?  ? ?START taking these medications  ? Details  ?azithromycin  (ZITHROMAX) 250 MG tablet Take 1 tablet (250 mg total) by mouth daily. Take first 2 tablets together, then 1 every day until finished., Starting Sat 10/27/2021, Normal  ?  ?benzonatate (TESSALON) 100 MG capsule Take 1 capsule (100 mg total) by mouth 3 (three) times daily as needed for cough., Starting Sat 10/27/2021, Normal  ?  ?ibuprofen (ADVIL) 800 MG tablet Take 1 tablet (800 mg total) by mouth every 8 (eight) hours as needed., Starting Sat 10/27/2021, Normal  ?  ?  ? ? ?Note:  This document was prepared using Dragon voice recognition software and may include unintentional dictation errors. ? ?Nanda Quinton, MD, FACEP ?Emergency Medicine ? ?  ?Margette Fast, MD ?10/30/21 [phone] ? ?

## 2021-10-27 NOTE — ED Triage Notes (Signed)
Pt from home c/o right rib pain x 2 days worse with coughing. Pt states she has chronic cough x 6 months. Denies chest pain. H/O Asthma. ? ?No acute distress noted in triage, pt talking in full sentences.  ?

## 2021-10-27 NOTE — Discharge Instructions (Signed)
You were seen in the emergency room today with chest wall pain.  I am calling in antibiotic and medication for cough and ibuprofen for pain.  Please follow with your primary care doctor in the coming week for reevaluation.  Return with any new or suddenly worsening symptoms. ?

## 2021-10-27 NOTE — ED Notes (Signed)
Pt provided discharge instructions and prescription information. Pt was given the opportunity to ask questions and questions were answered.   

## 2021-10-31 DIAGNOSIS — E039 Hypothyroidism, unspecified: Secondary | ICD-10-CM | POA: Diagnosis not present

## 2021-10-31 DIAGNOSIS — E782 Mixed hyperlipidemia: Secondary | ICD-10-CM | POA: Diagnosis not present

## 2021-10-31 DIAGNOSIS — E790 Hyperuricemia without signs of inflammatory arthritis and tophaceous disease: Secondary | ICD-10-CM | POA: Diagnosis not present

## 2021-10-31 DIAGNOSIS — R7982 Elevated C-reactive protein (CRP): Secondary | ICD-10-CM | POA: Diagnosis not present

## 2021-10-31 DIAGNOSIS — E559 Vitamin D deficiency, unspecified: Secondary | ICD-10-CM | POA: Diagnosis not present

## 2021-10-31 DIAGNOSIS — Z7689 Persons encountering health services in other specified circumstances: Secondary | ICD-10-CM | POA: Diagnosis not present

## 2021-10-31 DIAGNOSIS — R5383 Other fatigue: Secondary | ICD-10-CM | POA: Diagnosis not present

## 2021-10-31 DIAGNOSIS — E063 Autoimmune thyroiditis: Secondary | ICD-10-CM | POA: Diagnosis not present

## 2021-10-31 DIAGNOSIS — E7212 Methylenetetrahydrofolate reductase deficiency: Secondary | ICD-10-CM | POA: Diagnosis not present

## 2022-02-07 DIAGNOSIS — R790 Abnormal level of blood mineral: Secondary | ICD-10-CM | POA: Diagnosis not present

## 2022-02-07 DIAGNOSIS — G43109 Migraine with aura, not intractable, without status migrainosus: Secondary | ICD-10-CM | POA: Diagnosis not present

## 2022-02-07 DIAGNOSIS — A938 Other specified arthropod-borne viral fevers: Secondary | ICD-10-CM | POA: Diagnosis not present

## 2022-02-07 DIAGNOSIS — E063 Autoimmune thyroiditis: Secondary | ICD-10-CM | POA: Diagnosis not present

## 2022-02-07 DIAGNOSIS — K521 Toxic gastroenteritis and colitis: Secondary | ICD-10-CM | POA: Diagnosis not present

## 2022-03-07 DIAGNOSIS — R059 Cough, unspecified: Secondary | ICD-10-CM | POA: Diagnosis not present

## 2022-03-07 DIAGNOSIS — R509 Fever, unspecified: Secondary | ICD-10-CM | POA: Diagnosis not present

## 2022-03-07 DIAGNOSIS — J029 Acute pharyngitis, unspecified: Secondary | ICD-10-CM | POA: Diagnosis not present

## 2022-03-07 DIAGNOSIS — R062 Wheezing: Secondary | ICD-10-CM | POA: Diagnosis not present

## 2022-07-22 DIAGNOSIS — O021 Missed abortion: Secondary | ICD-10-CM | POA: Diagnosis not present

## 2022-10-07 DIAGNOSIS — N911 Secondary amenorrhea: Secondary | ICD-10-CM | POA: Diagnosis not present

## 2022-10-07 DIAGNOSIS — O200 Threatened abortion: Secondary | ICD-10-CM | POA: Diagnosis not present

## 2022-10-10 DIAGNOSIS — O200 Threatened abortion: Secondary | ICD-10-CM | POA: Diagnosis not present

## 2022-10-16 DIAGNOSIS — N911 Secondary amenorrhea: Secondary | ICD-10-CM | POA: Diagnosis not present

## 2022-10-17 DIAGNOSIS — E039 Hypothyroidism, unspecified: Secondary | ICD-10-CM | POA: Diagnosis not present

## 2022-10-17 DIAGNOSIS — I10 Essential (primary) hypertension: Secondary | ICD-10-CM | POA: Diagnosis not present

## 2022-11-04 LAB — OB RESULTS CONSOLE GC/CHLAMYDIA
Chlamydia: NEGATIVE
Neisseria Gonorrhea: NEGATIVE

## 2022-11-06 DIAGNOSIS — Z3685 Encounter for antenatal screening for Streptococcus B: Secondary | ICD-10-CM | POA: Diagnosis not present

## 2022-11-06 DIAGNOSIS — Z3481 Encounter for supervision of other normal pregnancy, first trimester: Secondary | ICD-10-CM | POA: Diagnosis not present

## 2022-11-06 DIAGNOSIS — Z3A09 9 weeks gestation of pregnancy: Secondary | ICD-10-CM | POA: Diagnosis not present

## 2022-11-06 LAB — OB RESULTS CONSOLE GBS: GBS: POSITIVE

## 2022-11-06 LAB — OB RESULTS CONSOLE RPR: RPR: NONREACTIVE

## 2022-11-06 LAB — OB RESULTS CONSOLE HIV ANTIBODY (ROUTINE TESTING): HIV: NONREACTIVE

## 2022-11-06 LAB — HEPATITIS C ANTIBODY: HCV Ab: NEGATIVE

## 2022-11-06 LAB — OB RESULTS CONSOLE HEPATITIS B SURFACE ANTIGEN: Hepatitis B Surface Ag: NEGATIVE

## 2022-11-06 LAB — OB RESULTS CONSOLE RUBELLA ANTIBODY, IGM: Rubella: IMMUNE

## 2022-11-13 DIAGNOSIS — E039 Hypothyroidism, unspecified: Secondary | ICD-10-CM | POA: Diagnosis not present

## 2022-11-21 DIAGNOSIS — Z3400 Encounter for supervision of normal first pregnancy, unspecified trimester: Secondary | ICD-10-CM | POA: Diagnosis not present

## 2022-11-21 DIAGNOSIS — Z3A11 11 weeks gestation of pregnancy: Secondary | ICD-10-CM | POA: Diagnosis not present

## 2022-11-21 DIAGNOSIS — Z113 Encounter for screening for infections with a predominantly sexual mode of transmission: Secondary | ICD-10-CM | POA: Diagnosis not present

## 2022-12-14 ENCOUNTER — Encounter (HOSPITAL_COMMUNITY): Payer: Self-pay

## 2022-12-14 ENCOUNTER — Inpatient Hospital Stay (HOSPITAL_COMMUNITY)
Admission: AD | Admit: 2022-12-14 | Discharge: 2022-12-14 | Disposition: A | Discharge status: Home / Self Care | Payer: BC Managed Care – PPO | Attending: Obstetrics and Gynecology | Admitting: Obstetrics and Gynecology

## 2022-12-14 DIAGNOSIS — R079 Chest pain, unspecified: Secondary | ICD-10-CM | POA: Insufficient documentation

## 2022-12-14 DIAGNOSIS — R002 Palpitations: Secondary | ICD-10-CM

## 2022-12-14 DIAGNOSIS — Z3490 Encounter for supervision of normal pregnancy, unspecified, unspecified trimester: Secondary | ICD-10-CM

## 2022-12-14 DIAGNOSIS — O10919 Unspecified pre-existing hypertension complicating pregnancy, unspecified trimester: Secondary | ICD-10-CM

## 2022-12-14 DIAGNOSIS — O10012 Pre-existing essential hypertension complicating pregnancy, second trimester: Secondary | ICD-10-CM | POA: Insufficient documentation

## 2022-12-14 DIAGNOSIS — R5383 Other fatigue: Secondary | ICD-10-CM | POA: Insufficient documentation

## 2022-12-14 DIAGNOSIS — Z3A15 15 weeks gestation of pregnancy: Secondary | ICD-10-CM | POA: Insufficient documentation

## 2022-12-14 DIAGNOSIS — O26812 Pregnancy related exhaustion and fatigue, second trimester: Secondary | ICD-10-CM | POA: Diagnosis not present

## 2022-12-14 DIAGNOSIS — I10 Essential (primary) hypertension: Secondary | ICD-10-CM | POA: Diagnosis present

## 2022-12-14 DIAGNOSIS — R0602 Shortness of breath: Secondary | ICD-10-CM | POA: Insufficient documentation

## 2022-12-14 DIAGNOSIS — E039 Hypothyroidism, unspecified: Secondary | ICD-10-CM | POA: Diagnosis present

## 2022-12-14 DIAGNOSIS — O26892 Other specified pregnancy related conditions, second trimester: Secondary | ICD-10-CM | POA: Insufficient documentation

## 2022-12-14 DIAGNOSIS — Z3492 Encounter for supervision of normal pregnancy, unspecified, second trimester: Secondary | ICD-10-CM

## 2022-12-14 LAB — URINALYSIS, ROUTINE W REFLEX MICROSCOPIC
Bilirubin Urine: NEGATIVE
Glucose, UA: NEGATIVE mg/dL
Hgb urine dipstick: NEGATIVE
Ketones, ur: NEGATIVE mg/dL
Leukocytes,Ua: NEGATIVE
Nitrite: NEGATIVE
Protein, ur: NEGATIVE mg/dL
Specific Gravity, Urine: 1.008 (ref 1.005–1.030)
pH: 6 (ref 5.0–8.0)

## 2022-12-14 LAB — CBC WITH DIFFERENTIAL/PLATELET
Abs Immature Granulocytes: 0.02 10*3/uL (ref 0.00–0.07)
Basophils Absolute: 0 10*3/uL (ref 0.0–0.1)
Basophils Relative: 0 %
Eosinophils Absolute: 0.1 10*3/uL (ref 0.0–0.5)
Eosinophils Relative: 2 %
HCT: 35.9 % — ABNORMAL LOW (ref 36.0–46.0)
Hemoglobin: 12.5 g/dL (ref 12.0–15.0)
Immature Granulocytes: 0 %
Lymphocytes Relative: 26 %
Lymphs Abs: 2 10*3/uL (ref 0.7–4.0)
MCH: 31 pg (ref 26.0–34.0)
MCHC: 34.8 g/dL (ref 30.0–36.0)
MCV: 89.1 fL (ref 80.0–100.0)
Monocytes Absolute: 0.5 10*3/uL (ref 0.1–1.0)
Monocytes Relative: 7 %
Neutro Abs: 4.9 10*3/uL (ref 1.7–7.7)
Neutrophils Relative %: 65 %
Platelets: 264 10*3/uL (ref 150–400)
RBC: 4.03 MIL/uL (ref 3.87–5.11)
RDW: 12.8 % (ref 11.5–15.5)
WBC: 7.5 10*3/uL (ref 4.0–10.5)
nRBC: 0 % (ref 0.0–0.2)

## 2022-12-14 LAB — COMPREHENSIVE METABOLIC PANEL
ALT: 23 U/L (ref 0–44)
AST: 24 U/L (ref 15–41)
Albumin: 3.6 g/dL (ref 3.5–5.0)
Alkaline Phosphatase: 77 U/L (ref 38–126)
Anion gap: 8 (ref 5–15)
BUN: 7 mg/dL (ref 6–20)
CO2: 22 mmol/L (ref 22–32)
Calcium: 9.2 mg/dL (ref 8.9–10.3)
Chloride: 106 mmol/L (ref 98–111)
Creatinine, Ser: 0.66 mg/dL (ref 0.44–1.00)
GFR, Estimated: >60 mL/min (ref >60)
Glucose, Bld: 100 mg/dL — ABNORMAL HIGH (ref 70–99)
Potassium: 3.6 mmol/L (ref 3.5–5.1)
Sodium: 136 mmol/L (ref 135–145)
Total Bilirubin: 0.4 mg/dL (ref 0.3–1.2)
Total Protein: 6.8 g/dL (ref 6.5–8.1)

## 2022-12-14 LAB — BRAIN NATRIURETIC PEPTIDE: B Natriuretic Peptide: 63.3 pg/mL (ref 0.0–100.0)

## 2022-12-14 MED ORDER — LACTATED RINGERS IV BOLUS
1000.0000 mL | Freq: Once | INTRAVENOUS | Status: AC
  Administered 2022-12-14: 1000 mL via INTRAVENOUS

## 2022-12-14 MED ORDER — NIFEDIPINE ER OSMOTIC RELEASE 30 MG PO TB24: 30.0000 mg | ORAL_TABLET | Freq: Every day | ORAL | 6 refills | Status: DC

## 2022-12-14 NOTE — MAU Note (Signed)
.  Misty [name] is a 32 y.o. at Unknown here in MAU reporting: around 1230 she started feeling very weak and short of breath. States that she has known CHTN on 100mg  of labetalol and did take her dose this morning. Denies VB or LOF.  LMP: 09/17/22 Pain score: 0 Vitals:   12/14/22 1424  BP: (!) 162/82  Pulse: (!) 106  Resp: (!) 22  Temp: 98.2 F (36.8 C)  SpO2: 100%     FHT:161 Lab orders placed from triage:  UA

## 2022-12-14 NOTE — MAU Provider Note (Addendum)
History     CSN: [id number]  Arrival date and time: 12/14/22 1358   Event Date/Time   First Provider Initiated Contact with Patient 12/14/22 1501      Chief Complaint  Patient presents with   Shortness of Breath   Extremity Weakness   Misty [name] , a  32 y.o. G2P1001 at [redacted]w[redacted]d presents to MAU with complaints of shortness of breath, palpitations and weakness. She reports that at 12:30pm while she was putting away clothes she started to feel lightheaded and "like her heart was racing." She states that she became hot, and just felt weak. She states that she has felt fatigued all week but this was different. She states that she has a headache and neck ache that she rates a 3/10. States she took PO 1000mg  of Tylenol yesterday with relief. Denies attempting to relive today. She denies nausea and vomiting, chest pain or pressure, difficulty breathing. Reports that it "feels like I need to take a deep breath but just can't." She Endorses nasal congestion but no other cold or flu-like symptoms. She states she has a history of acute asthma, denies attempting to use inhaler. She also reports Hasimotos on NP Tyroid (last check in May and normal per report.) She is also on Sertraline. She denies abnormal vaginal bleeding, leaking of fluid or abnormal vaginal discharge.         OB History     Gravida  2   Para  1   Term  1   Preterm      AB      Living  1      SAB      IAB      Ectopic      Multiple  0   Live Births  1          Past Medical History:  Diagnosis Date   Anemia    Asthma    COVID-19 07/14/2019   + 07/13/19   Hashimoto's disease 2021   Pregnancy induced hypertension    Past Surgical History:  Procedure Laterality Date   CESAREAN SECTION N/A 07/15/2019   Procedure: CESAREAN SECTION;  Surgeon: [name], MD;  Location: [facility];  Service: Obstetrics;  Laterality: N/A;   NO PAST SURGERIES     Family History  Adopted: Yes  Problem Relation Age of  Onset   Hypertension Father    Social History   Tobacco Use   Smoking status: Former   Smokeless tobacco: Never  Building services engineer Use: Never used  Substance Use Topics   Alcohol use: No   Drug use: No   Allergies: No Known Allergies  No medications prior to admission.   Review of Systems  Constitutional:  Positive for activity change and fatigue. Negative for chills and fever.  Eyes:  Negative for pain and visual disturbance.  Respiratory:  Positive for shortness of breath. Negative for apnea and wheezing.   Cardiovascular:  Positive for palpitations. Negative for chest pain.  Gastrointestinal:  Negative for abdominal pain, constipation, diarrhea, nausea and vomiting.  Genitourinary:  Negative for difficulty urinating, dysuria, pelvic pain, vaginal bleeding, vaginal discharge and vaginal pain.  Musculoskeletal:  Negative for back pain.  Neurological:  Positive for weakness, light-headedness and headaches. Negative for dizziness and seizures.  Psychiatric/Behavioral:  Negative for suicidal ideas.    Physical Exam   Blood pressure (!) 156/68, pulse 88, temperature 98 F (36.7 C), temperature source Oral, resp. rate 20, height 5' 5.5" (1.664  m), SpO2 100 %, unknown if currently breastfeeding.  Physical Exam Vitals and nursing note reviewed.  Constitutional:      General: She is not in acute distress.    Appearance: Normal appearance.     Comments: Patient appears pale  HENT:     Head: Normocephalic.  Cardiovascular:     Rate and Rhythm: Normal rate.     Heart sounds: Normal heart sounds.  Pulmonary:     Effort: Pulmonary effort is normal.     Breath sounds: No decreased breath sounds, wheezing, rhonchi or rales.  Chest:     Chest wall: No tenderness.  Musculoskeletal:        General: Normal range of motion.     Cervical back: Normal range of motion.  Skin:    General: Skin is warm.     Comments: Skin, dry but overall feels clammy.   Neurological:     General:  No focal deficit present.     Mental Status: She is alert and oriented to person, place, and time.     GCS: GCS eye subscore is 4. GCS verbal subscore is 5. GCS motor subscore is 6.     Sensory: Sensation is intact.     Motor: No weakness or abnormal muscle tone.     Coordination: Coordination normal.  Psychiatric:        Mood and Affect: Mood normal.    FHT obtained in triage.   MDM & MAU Course  MDM: Moderate Orders Placed This Encounter  Procedures   Brain natriuretic peptide   Comprehensive metabolic panel   CBC with Differential/Platelet   Urinalysis, Routine w reflex microscopic -Urine, Clean Catch   ED EKG   Discharge patient   Meds ordered this encounter  Medications   lactated ringers bolus 1,000 mL   NIFEdipine (PROCARDIA-XL/NIFEDICAL-XL) 30 MG 24 hr tablet    Sig: Take 1 tablet (30 mg total) by mouth daily.    Dispense:  30 tablet    Refill:  6   MAU Course: - CNM offered pain medication for head and neck pain, patient declined.  - Lungs clear, 02 sats at 100% no tachycardia.  - Given palpitations, EKG ordered - LR bolus ordered  - @ 1600 Reports and transfer of care to Tyler Aas, CNM.  - Shantonette Danella Deis) Suzie Portela, MSN, CNM 12/14/2022   Assumed care of patient, on review of labs - all WNL. EKG=NSR. Patient endorsed feeling a little better after IV fluids. Reassurance given that no emergent cause apparent for her sudden fatigue/weakness (CBC also normal). Explained that this can happen in pregnancy in response to heat, hormone fluctuations, potentially to her labetalol, or her BP that continues to be elevated despite 100mg  labetalol BID. Offered to switch her to nifedipine which may work better to control her BP, pt accepted.  Switched to 30mg  procardia XR daily, warned about possible side effect of headaches when first starting and encouraged her to use Excedrin-Tension to relieve headaches, pt expressed understanding. Will also [facility] to [facility] since she has  chronic hypertension and now a history of palpitations. Stable for discharge home.  Assessment and Plan  Fatigue during pregnancy in second trimester  Palpitations  Presence of fetal heart sounds in second trimester  [redacted] weeks gestation of pregnancy  Chronic hypertension - Plan: Discharge patient  Chronic hypertension affecting pregnancy   Allergies as of 12/14/2022   No Known Allergies      Medication List     STOP taking these  medications    azithromycin 250 MG tablet Commonly known as: ZITHROMAX   benzonatate 100 MG capsule Commonly known as: TESSALON   HYDROcodone-acetaminophen 5-325 MG tablet Commonly known as: NORCO/VICODIN   ibuprofen 600 MG tablet Commonly known as: ADVIL   ibuprofen 800 MG tablet Commonly known as: ADVIL   labetalol 100 MG tablet Commonly known as: NORMODYNE       TAKE these medications    Doxylamine-Pyridoxine 10-10 MG Tbec Take 2 tablets by mouth at bedtime.   NIFEdipine 30 MG 24 hr tablet Commonly known as: PROCARDIA-XL/NIFEDICAL-XL Take 1 tablet (30 mg total) by mouth daily.   prenatal multivitamin Tabs tablet Take 1 tablet by mouth at bedtime.   sertraline 100 MG tablet Commonly known as: ZOLOFT Take 100 mg by mouth daily.   thyroid 90 MG tablet Commonly known as: ARMOUR Take 90 mg by mouth daily.       Follow up at [facility] as scheduled for ongoing prenatal care. - OB Cardio clinic will be in touch to schedule visit  Edd Arbour, CNM, MSN, IBCLC Certified Nurse Midwife, [facility]  I provided general supervision for this patient and was immediately available for the care of this patient.  Braxton Feathers, DO MFM/OB attending

## 2022-12-15 ENCOUNTER — Encounter (HOSPITAL_COMMUNITY): Payer: Self-pay | Admitting: Maternal & Fetal Medicine

## 2022-12-15 ENCOUNTER — Other Ambulatory Visit: Payer: Self-pay | Admitting: Certified Nurse Midwife

## 2022-12-15 DIAGNOSIS — R002 Palpitations: Secondary | ICD-10-CM

## 2022-12-15 DIAGNOSIS — E039 Hypothyroidism, unspecified: Secondary | ICD-10-CM | POA: Diagnosis present

## 2022-12-15 DIAGNOSIS — O10919 Unspecified pre-existing hypertension complicating pregnancy, unspecified trimester: Secondary | ICD-10-CM

## 2022-12-15 DIAGNOSIS — I10 Essential (primary) hypertension: Secondary | ICD-10-CM | POA: Diagnosis present

## 2022-12-15 DIAGNOSIS — Z3490 Encounter for supervision of normal pregnancy, unspecified, unspecified trimester: Secondary | ICD-10-CM

## 2022-12-19 DIAGNOSIS — Z3481 Encounter for supervision of other normal pregnancy, first trimester: Secondary | ICD-10-CM | POA: Diagnosis not present

## 2023-01-06 DIAGNOSIS — O169 Unspecified maternal hypertension, unspecified trimester: Secondary | ICD-10-CM | POA: Diagnosis not present

## 2023-01-06 DIAGNOSIS — E039 Hypothyroidism, unspecified: Secondary | ICD-10-CM | POA: Diagnosis not present

## 2023-01-06 DIAGNOSIS — Z3A18 18 weeks gestation of pregnancy: Secondary | ICD-10-CM | POA: Diagnosis not present

## 2023-01-06 DIAGNOSIS — Z363 Encounter for antenatal screening for malformations: Secondary | ICD-10-CM | POA: Diagnosis not present

## 2023-02-07 ENCOUNTER — Ambulatory Visit: Payer: Commercial Managed Care - PPO | Attending: Cardiology

## 2023-02-07 ENCOUNTER — Ambulatory Visit: Payer: Commercial Managed Care - PPO | Admitting: Cardiology

## 2023-02-07 VITALS — BP 154/84 | HR 85 | Ht 65.0 in | Wt 244.0 lb

## 2023-02-07 DIAGNOSIS — R002 Palpitations: Secondary | ICD-10-CM

## 2023-02-07 DIAGNOSIS — Z3492 Encounter for supervision of normal pregnancy, unspecified, second trimester: Secondary | ICD-10-CM

## 2023-02-07 DIAGNOSIS — Z3A23 23 weeks gestation of pregnancy: Secondary | ICD-10-CM | POA: Diagnosis not present

## 2023-02-07 DIAGNOSIS — I10 Essential (primary) hypertension: Secondary | ICD-10-CM

## 2023-02-07 DIAGNOSIS — R0609 Other forms of dyspnea: Secondary | ICD-10-CM | POA: Diagnosis not present

## 2023-02-07 MED ORDER — NIFEDIPINE ER OSMOTIC RELEASE 60 MG PO TB24: 60.0000 mg | ORAL_TABLET | Freq: Every day | ORAL | 3 refills | Status: DC

## 2023-02-07 NOTE — Patient Instructions (Addendum)
Medication Instructions:  Increase Nifedipine to 60 mg daily   *If you need a refill on your cardiac medications before your next appointment, please call your pharmacy*   Lab Work: None ordered   If you have labs (blood work) drawn today and your tests are completely normal, you will receive your results only by: MyChart Message (if you have MyChart) OR A paper copy in the mail If you have any lab test that is abnormal or we need to change your treatment, we will call you to review the results.   Testing/Procedures: Your physician has requested that you have an echocardiogram. Echocardiography is a painless test that uses sound waves to create images of your heart. It provides your doctor with information about the size and shape of your heart and how well your heart's chambers and valves are working. This procedure takes approximately one hour. There are no restrictions for this procedure. Please do NOT wear cologne, perfume, aftershave, or lotions (deodorant is allowed). Please arrive 15 minutes prior to your appointment time.  A zio monitor was ordered today. It will remain on for 14 days. You will then return monitor and event diary in provided box. It takes 1-2 weeks for report to be downloaded and returned to [place]. We will call you with the results. If monitor falls off or has orange flashing light, please call Zio for further instructions.     Follow-Up: At [facility], you and your health needs are our priority.  As part of our continuing mission to provide you with exceptional heart care, we have created designated Provider Care Teams.  These Care Teams include your primary Cardiologist (physician) and Advanced Practice Providers (APPs -  Physician Assistants and Nurse Practitioners) who all work together to provide you with the care you need, when you need it.  We recommend signing up for the patient portal called "MyChart".  Sign up information is provided on this After  Visit Summary.  MyChart is used to connect with patients for Virtual Visits (Telemedicine).  Patients are able to view lab/test results, encounter notes, upcoming appointments, etc.  Non-urgent messages can be sent to your provider as well.   To learn more about what you can do with MyChart, go to [email or web address].    Your next appointment:   6 week(s)  Provider:   Thomasene Ripple, DO at [facility] to double book)   Other Instructions Your phyiscian has referred you to see Cheree Ditto, [facility] for hypertension   Take your blood pressure daily and upload readings to Mychart after 2 weeks   ZIO XT- Long Term Monitor Instructions  Your physician has requested you wear a ZIO patch monitor for 14 days.  This is a single patch monitor. [employer] supplies one patch monitor per enrollment. Additional stickers are not available. Please do not apply patch if you will be having a Nuclear Stress Test,  Echocardiogram, Cardiac CT, MRI, or Chest Xray during the period you would be wearing the  monitor. The patch cannot be worn during these tests. You cannot remove and re-apply the  ZIO XT patch monitor.  Your ZIO patch monitor will be mailed 3 day USPS to your address on file. It may take 3-5 days  to receive your monitor after you have been enrolled.  Once you have received your monitor, please review the enclosed instructions. Your monitor  has already been registered assigning a specific monitor serial # to you.  Billing and Patient Assistance Program Information  We have supplied [employer] with any of your insurance information on file for billing purposes. [employer] offers a sliding scale Patient Assistance Program for patients that do not have  insurance, or whose insurance does not completely cover the cost of the ZIO monitor.  You must apply for the Patient Assistance Program to qualify for this discounted rate.  To apply, please call [employer] at [phone], select option 4, select  option 2, ask to apply for  Patient Assistance Program. Meredeth Ide will ask your household income, and how many people  are in your household. They will quote your out-of-pocket cost based on that information.  [employer] will also be able to set up a 67-month, interest-free payment plan if needed.  Applying the monitor   Shave hair from upper left chest.  Hold abrader disc by orange tab. Rub abrader in 40 strokes over the upper left chest as  indicated in your monitor instructions.  Clean area with 4 enclosed alcohol pads. Let dry.  Apply patch as indicated in monitor instructions. Patch will be placed under collarbone on left  side of chest with arrow pointing upward.  Rub patch adhesive wings for 2 minutes. Remove white label marked "1". Remove the white  label marked "2". Rub patch adhesive wings for 2 additional minutes.  While looking in a mirror, press and release button in center of patch. A small green light will  flash 3-4 times. This will be your only indicator that the monitor has been turned on.  Do not shower for the first 24 hours. You may shower after the first 24 hours.  Press the button if you feel a symptom. You will hear a small click. Record Date, Time and  Symptom in the Patient Logbook.  When you are ready to remove the patch, follow instructions on the last 2 pages of Patient  Logbook. Stick patch monitor onto the last page of Patient Logbook.  Place Patient Logbook in the blue and white box. Use locking tab on box and tape box closed  securely. The blue and white box has prepaid postage on it. Please place it in the mailbox as  soon as possible. Your physician should have your test results approximately 7 days after the  monitor has been mailed back to [facility].  Call [facility] ([facility] Customer Care at [phone] if you have questions regarding  your ZIO XT patch monitor. Call them immediately if you see an orange light blinking on your  monitor.  If your monitor  falls off in less than 4 days, contact our Monitor department at [phone].  If your monitor becomes loose or falls off after 4 days call [employer] at [phone] for  suggestions on securing your monitor

## 2023-02-07 NOTE — Progress Notes (Unsigned)
Cardio-Obstetrics Clinic  New Evaluation  Date:  02/09/2023   ID:  Misty [name], DOB 1990-12-27, MRN [id number]  PCP:  Pcp, No   [facility] Providers Cardiologist:  Thomasene Ripple, DO  Electrophysiologist:  None       Referring MD: Bernerd Limbo, CNM   Chief Complaint: I am okay  History of Present Illness:    Misty [name] is a 32 y.o. female [G2P1001] who is being seen today for the evaluation of hypertension pregnancy at the request of Bernerd Limbo, CNM.   Medical history includes gestational hypertension, Hashimoto's thyroid disease, obesity.  She has been asked to see cardiology due to repeated hypertension in pregnancy.  She is currently [redacted] weeks pregnant.  Prior CV Studies Reviewed: The following studies were reviewed today:   Past Medical History:  Diagnosis Date   Anemia    Asthma    COVID-19 07/14/2019   + 07/13/19   Hashimoto's disease 2021   Pregnancy induced hypertension     Past Surgical History:  Procedure Laterality Date   CESAREAN SECTION N/A 07/15/2019   Procedure: CESAREAN SECTION;  Surgeon: [name], MD;  Location: [facility] LD ORS;  Service: Obstetrics;  Laterality: N/A;   NO PAST SURGERIES        OB History     Gravida  2   Para  1   Term  1   Preterm      AB      Living  1      SAB      IAB      Ectopic      Multiple  0   Live Births  1               Current Medications: Current Meds  Medication Sig   aspirin EC 81 MG tablet Take 81 mg by mouth daily. Swallow whole.   Doxylamine-Pyridoxine 10-10 MG TBEC Take 2 tablets by mouth at bedtime.   labetalol (NORMODYNE) 200 MG tablet Take 200 mg by mouth 2 (two) times daily.   NIFEdipine (PROCARDIA XL/NIFEDICAL XL) 60 MG 24 hr tablet Take 1 tablet (60 mg total) by mouth daily.   Prenatal Vit-Fe Fumarate-FA (PRENATAL MULTIVITAMIN) TABS tablet Take 1 tablet by mouth at bedtime.    sertraline (ZOLOFT) 100 MG tablet Take 100 mg by mouth daily.    thyroid (ARMOUR) 90 MG tablet Take 90 mg by mouth daily.   [DISCONTINUED] NIFEdipine (PROCARDIA-XL/NIFEDICAL-XL) 30 MG 24 hr tablet Take 1 tablet (30 mg total) by mouth daily.     Allergies:   Patient has no known allergies.   Social History   Socioeconomic History   Marital status: Married    Spouse name: Willette Alma   Number of children: Not on file   Years of education: Not on file   Highest education level: Not on file  Occupational History   Not on file  Tobacco Use   Smoking status: Former   Smokeless tobacco: Never  Vaping Use   Vaping status: Never Used  Substance and Sexual Activity   Alcohol use: No   Drug use: No   Sexual activity: Yes    Birth control/protection: Pill  Other Topics Concern   Not on file  Social History Narrative   Not on file   Social Determinants of Health   Financial Resource Strain: Low Risk  (06/19/2021)   Received from [facility], [facility]   Overall Financial Resource Strain (CARDIA)    Difficulty  of Paying Living Expenses: Not hard at all  Food Insecurity: No Food Insecurity (07/30/2021)   Received from [facility], [facility]   Hunger Vital Sign    Worried About Running Out of Food in the Last Year: Never true    Ran Out of Food in the Last Year: Never true  Transportation Needs: No Transportation Needs (06/19/2021)   Received from [facility], [facility]   [facility] [place] - Transportation    Lack of Transportation (Medical): No    Lack of Transportation (Non-Medical): No  Physical Activity: Sufficiently Active (06/19/2021)   Received from [facility]   Exercise Vital Sign    Days of Exercise per Week: 5 days    Minutes of Exercise per Session: 150+ min  Stress: No Stress Concern Present (06/19/2021)   Received from [facility], [facility] Occupational [facility] - Occupational Stress Questionnaire    Feeling of Stress : Only a little  Social Connections: Unknown (10/27/2021)    Received from [facility], [facility]   Social Network    Social Network: Not on file      Family History  Adopted: Yes  Problem Relation Age of Onset   Hypertension Father       ROS:   Please see the history of present illness.     All other systems reviewed and are negative.   Labs/EKG Reviewed:    EKG:   EKG was not ordered today.    Recent Labs: 12/14/2022: ALT 23; B Natriuretic Peptide 63.3; BUN 7; Creatinine, Ser 0.66; Hemoglobin 12.5; Platelets 264; Potassium 3.6; Sodium 136   Recent Lipid Panel No results found for: "CHOL", "TRIG", "HDL", "CHOLHDL", "LDLCALC", "LDLDIRECT"  Physical Exam:    VS:  BP (!) 154/84   Pulse 85   Ht 5\' 5"  (1.651 m)   Wt 244 lb (110.7 kg)   SpO2 98%   BMI 40.60 kg/m     Wt Readings from Last 3 Encounters:  02/07/23 244 lb (110.7 kg)  10/27/21 240 lb (108.9 kg)  07/15/19 265 lb 8 oz (120.4 kg)     GEN:  Well nourished, well developed in no acute distress HEENT: Normal NECK: No JVD; No carotid bruits LYMPHATICS: No lymphadenopathy CARDIAC: RRR, no murmurs, rubs, gallops RESPIRATORY:  Clear to auscultation without rales, wheezing or rhonchi  ABDOMEN: Soft, non-tender, non-distended MUSCULOSKELETAL:  No edema; No deformity  SKIN: Warm and dry NEUROLOGIC:  Alert and oriented x 3 PSYCHIATRIC:  Normal affect    Risk Assessment/Risk Calculators:     CARPREG II Risk Prediction Index Score:  1.  The patient's risk for a primary cardiac event is 5%.            ASSESSMENT & PLAN:    Gestational Hypertension  23 weeks of gestation  Cardiovascular care in pregnancy  Dyspnea at rest Palpitations   She is hypertensive in the office today.  Will increase the nifedipine to 60 mg daily.  Will continue labetalol at current dose of 200 mg twice daily.  I will have her follow closely with our cardio OB pharmacist and antihypertensive medication will be adjusted as appropriate.  I agree with her preeclampsia prophylaxis of  aspirin 81 mg daily.  Will continue this.  In terms of her palpitations and shortness of breath at rest we will place a ZIO monitor on the patient to understand to make sure that there is no significant arrhythmia playing a role here given her increase  heart rate is closer to 150s.  And for shortness of breath at rest is concerning.  So we will get a 2D echo to assess for any structural abnormalities.  She will take her blood pressure every day and update MyChart in 2 weeks.  I will see her back in 6 weeks.   Patient Instructions  Medication Instructions:  Increase Nifedipine to 60 mg daily   *If you need a refill on your cardiac medications before your next appointment, please call your pharmacy*   Lab Work: None ordered   If you have labs (blood work) drawn today and your tests are completely normal, you will receive your results only by: MyChart Message (if you have MyChart) OR A paper copy in the mail If you have any lab test that is abnormal or we need to change your treatment, we will call you to review the results.   Testing/Procedures: Your physician has requested that you have an echocardiogram. Echocardiography is a painless test that uses sound waves to create images of your heart. It provides your doctor with information about the size and shape of your heart and how well your heart's chambers and valves are working. This procedure takes approximately one hour. There are no restrictions for this procedure. Please do NOT wear cologne, perfume, aftershave, or lotions (deodorant is allowed). Please arrive 15 minutes prior to your appointment time.  A zio monitor was ordered today. It will remain on for 14 days. You will then return monitor and event diary in provided box. It takes 1-2 weeks for report to be downloaded and returned to [place]. We will call you with the results. If monitor falls off or has orange flashing light, please call Zio for further instructions.      Follow-Up: At [facility], you and your health needs are our priority.  As part of our continuing mission to provide you with exceptional heart care, we have created designated Provider Care Teams.  These Care Teams include your primary Cardiologist (physician) and Advanced Practice Providers (APPs -  Physician Assistants and Nurse Practitioners) who all work together to provide you with the care you need, when you need it.  We recommend signing up for the patient portal called "MyChart".  Sign up information is provided on this After Visit Summary.  MyChart is used to connect with patients for Virtual Visits (Telemedicine).  Patients are able to view lab/test results, encounter notes, upcoming appointments, etc.  Non-urgent messages can be sent to your provider as well.   To learn more about what you can do with MyChart, go to [email or web address].    Your next appointment:   6 week(s)  Provider:   Thomasene Ripple, DO at [facility] to double book)   Other Instructions Your phyiscian has referred you to see Cheree Ditto, [facility] for hypertension   Take your blood pressure daily and upload readings to Mychart after 2 weeks   ZIO XT- Long Term Monitor Instructions  Your physician has requested you wear a ZIO patch monitor for 14 days.  This is a single patch monitor. [employer] supplies one patch monitor per enrollment. Additional stickers are not available. Please do not apply patch if you will be having a Nuclear Stress Test,  Echocardiogram, Cardiac CT, MRI, or Chest Xray during the period you would be wearing the  monitor. The patch cannot be worn during these tests. You cannot remove and re-apply the  ZIO XT patch monitor.  Your ZIO patch monitor will be  mailed 3 day USPS to your address on file. It may take 3-5 days  to receive your monitor after you have been enrolled.  Once you have received your monitor, please review the enclosed instructions. Your monitor  has  already been registered assigning a specific monitor serial # to you.  Billing and Patient Assistance Program Information  We have supplied [employer] with any of your insurance information on file for billing purposes. [employer] offers a sliding scale Patient Assistance Program for patients that do not have  insurance, or whose insurance does not completely cover the cost of the ZIO monitor.  You must apply for the Patient Assistance Program to qualify for this discounted rate.  To apply, please call [employer] at [phone], select option 4, select option 2, ask to apply for  Patient Assistance Program. Meredeth Ide will ask your household income, and how many people  are in your household. They will quote your out-of-pocket cost based on that information.  [employer] will also be able to set up a 38-month, interest-free payment plan if needed.  Applying the monitor   Shave hair from upper left chest.  Hold abrader disc by orange tab. Rub abrader in 40 strokes over the upper left chest as  indicated in your monitor instructions.  Clean area with 4 enclosed alcohol pads. Let dry.  Apply patch as indicated in monitor instructions. Patch will be placed under collarbone on left  side of chest with arrow pointing upward.  Rub patch adhesive wings for 2 minutes. Remove white label marked "1". Remove the white  label marked "2". Rub patch adhesive wings for 2 additional minutes.  While looking in a mirror, press and release button in center of patch. A small green light will  flash 3-4 times. This will be your only indicator that the monitor has been turned on.  Do not shower for the first 24 hours. You may shower after the first 24 hours.  Press the button if you feel a symptom. You will hear a small click. Record Date, Time and  Symptom in the Patient Logbook.  When you are ready to remove the patch, follow instructions on the last 2 pages of Patient  Logbook. Stick patch monitor onto the last page of  Patient Logbook.  Place Patient Logbook in the blue and white box. Use locking tab on box and tape box closed  securely. The blue and white box has prepaid postage on it. Please place it in the mailbox as  soon as possible. Your physician should have your test results approximately 7 days after the  monitor has been mailed back to [facility].  Call [facility] Customer Care at [phone] if you have questions regarding  your ZIO XT patch monitor. Call them immediately if you see an orange light blinking on your  monitor.  If your monitor falls off in less than 4 days, contact our Monitor department at [phone].  If your monitor becomes loose or falls off after 4 days call [employer] at [phone] for  suggestions on securing your monitor    Dispo:  No follow-ups on file.   Medication Adjustments/Labs and Tests Ordered: Current medicines are reviewed at length with the patient today.  Concerns regarding medicines are outlined above.  Tests Ordered: Orders Placed This Encounter  Procedures   AMB Referral to [facility]   LONG TERM MONITOR (3-14 DAYS)   ECHOCARDIOGRAM COMPLETE   Medication Changes: Meds ordered this encounter  Medications   NIFEdipine (PROCARDIA XL/NIFEDICAL XL)  60 MG 24 hr tablet    Sig: Take 1 tablet (60 mg total) by mouth daily.    Dispense:  90 tablet    Refill:  3

## 2023-02-07 NOTE — Progress Notes (Unsigned)
Enrolled patient for a 14 day Zio XT  monitor to be mailed to patients home  °

## 2023-02-14 DIAGNOSIS — R002 Palpitations: Secondary | ICD-10-CM

## 2023-02-17 DIAGNOSIS — Z3A24 24 weeks gestation of pregnancy: Secondary | ICD-10-CM | POA: Diagnosis not present

## 2023-02-17 DIAGNOSIS — O99212 Obesity complicating pregnancy, second trimester: Secondary | ICD-10-CM | POA: Diagnosis not present

## 2023-02-17 DIAGNOSIS — Z362 Encounter for other antenatal screening follow-up: Secondary | ICD-10-CM | POA: Diagnosis not present

## 2023-02-18 ENCOUNTER — Telehealth: Payer: Self-pay | Admitting: Cardiology

## 2023-02-18 NOTE — Telephone Encounter (Signed)
Monitor placed on and states went to shower and now it is coming off.  She is advised to call company and see if it isstill working.  From there she may contact [address] office to help with reapplying or with stabilizing

## 2023-02-18 NOTE — Telephone Encounter (Signed)
Patient states she is having issues keeping her event monitor on and she is unsure whether it will still function properly. Patient states she waited longer than 24 hours to shower and kept it covered but still began coming off.

## 2023-02-19 ENCOUNTER — Telehealth: Payer: Self-pay | Admitting: Cardiology

## 2023-02-19 NOTE — Telephone Encounter (Signed)
Patient is returning call in regards to her heart monitor.

## 2023-02-19 NOTE — Telephone Encounter (Signed)
Will forward this to Dr. Tresa Endo and A. Wells. Pt is wearing the monitor now and took a shower and it is now coming off. Advised pt that she can tape down the sides where the adhesive is and then cover the entire unit with some TegaDerm until it is in place and not lose. Pt verbalized understanding and is going to get supplies.

## 2023-02-28 ENCOUNTER — Ambulatory Visit: Payer: BC Managed Care – PPO | Attending: Cardiology

## 2023-02-28 ENCOUNTER — Ambulatory Visit: Payer: BC Managed Care – PPO | Admitting: Pharmacist

## 2023-02-28 ENCOUNTER — Encounter: Payer: Self-pay | Admitting: Pharmacist

## 2023-02-28 VITALS — BP 128/70 | HR 84 | Wt 249.4 lb

## 2023-02-28 DIAGNOSIS — Z3492 Encounter for supervision of normal pregnancy, unspecified, second trimester: Secondary | ICD-10-CM | POA: Diagnosis not present

## 2023-02-28 DIAGNOSIS — Z0282 Encounter for adoption services: Secondary | ICD-10-CM | POA: Insufficient documentation

## 2023-02-28 DIAGNOSIS — I10 Essential (primary) hypertension: Secondary | ICD-10-CM

## 2023-02-28 DIAGNOSIS — R0609 Other forms of dyspnea: Secondary | ICD-10-CM | POA: Insufficient documentation

## 2023-02-28 DIAGNOSIS — O139 Gestational [pregnancy-induced] hypertension without significant proteinuria, unspecified trimester: Secondary | ICD-10-CM | POA: Insufficient documentation

## 2023-02-28 DIAGNOSIS — O99512 Diseases of the respiratory system complicating pregnancy, second trimester: Secondary | ICD-10-CM | POA: Insufficient documentation

## 2023-02-28 DIAGNOSIS — R0600 Dyspnea, unspecified: Secondary | ICD-10-CM | POA: Insufficient documentation

## 2023-02-28 DIAGNOSIS — E669 Obesity, unspecified: Secondary | ICD-10-CM | POA: Insufficient documentation

## 2023-02-28 DIAGNOSIS — Z8659 Personal history of other mental and behavioral disorders: Secondary | ICD-10-CM | POA: Insufficient documentation

## 2023-02-28 DIAGNOSIS — I340 Nonrheumatic mitral (valve) insufficiency: Secondary | ICD-10-CM | POA: Diagnosis not present

## 2023-02-28 DIAGNOSIS — D259 Leiomyoma of uterus, unspecified: Secondary | ICD-10-CM | POA: Insufficient documentation

## 2023-02-28 DIAGNOSIS — Z3A26 26 weeks gestation of pregnancy: Secondary | ICD-10-CM | POA: Insufficient documentation

## 2023-02-28 LAB — ECHOCARDIOGRAM COMPLETE
Area-P 1/2: 3.6 cm2
Calc EF: 55 %
MV M vel: 5.47 m/s
MV Peak grad: 119.7 mmHg
Radius: 0.4 cm
S' Lateral: 4.2 cm
Single Plane A2C EF: 55.5 %
Single Plane A4C EF: 54.6 %
Weight: 3990.4 [oz_av]

## 2023-02-28 MED ORDER — FUROSEMIDE 20 MG PO TABS
20.0000 mg | ORAL_TABLET | Freq: Every day | ORAL | 2 refills | Status: DC
Start: 2023-02-28 — End: 2023-03-02

## 2023-02-28 NOTE — Progress Notes (Signed)
Patient ID: Misty [name]                 DOB: [birth year]                      MRN: [id number]     HPI: Misty [name] is a 32 y.o. female referred by Dr. Servando Salina to HTN clinic. PMH is significant for HTN, asthma, Hashimoto disease and a history of mood disorders. Patient will be [redacted] weeks pregnant tomorrow.  Patient is G2P1001. This was a planned pregnancy, EDD 06/07/23. 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  Patient presents today with mother. Feels uncomfortable. Having trouble breathing, has lower extremity edema. Reports fatigue. Has had 5# weight gain since 02/07/23.  Says she was diagnosed with asthma as a child.  Currently managed on labetalol 200mg  BID and nifedipine 60mg  daily. No adverse effects reported. Has been checking BP at home but did not bring machine or log. Reports home readings from 130s-140s/80s-90s.  Scheduled for OGTT next Thursday.   Was adopted, does not know any family history.  Current HTN meds:  Labetalol 200mg  BID Nifedipine 60mg  daily   Wt Readings from Last 3 Encounters:  02/07/23 244 lb (110.7 kg)  10/27/21 240 lb (108.9 kg)  07/15/19 265 lb 8 oz (120.4 kg)   BP Readings from Last 3 Encounters:  02/07/23 (!) 154/84  12/14/22 (!) 156/68  10/27/21 134/74   Pulse Readings from Last 3 Encounters:  02/07/23 85  12/14/22 88  10/27/21 73    Renal function: CrCl cannot be calculated (Patient's most recent lab result is older than the maximum 21 days allowed.).  Past Medical History:  Diagnosis Date   Anemia    Asthma    COVID-19 07/14/2019   + 07/13/19   Hashimoto's disease 2021   Pregnancy induced hypertension     Current Outpatient Medications on File Prior to Visit  Medication Sig Dispense Refill   aspirin EC 81 MG tablet Take 81 mg by mouth daily. Swallow whole.     Doxylamine-Pyridoxine 10-10 MG TBEC Take 2 tablets by mouth at bedtime.     labetalol (NORMODYNE) 200 MG tablet Take 200 mg by mouth 2 (two) times daily.      levothyroxine (SYNTHROID) 125 MCG tablet Take 125 mcg by mouth daily before breakfast.     NIFEdipine (PROCARDIA XL/NIFEDICAL XL) 60 MG 24 hr tablet Take 1 tablet (60 mg total) by mouth daily. 90 tablet 3   Prenatal Vit-Fe Fumarate-FA (PRENATAL MULTIVITAMIN) TABS tablet Take 1 tablet by mouth at bedtime.      sertraline (ZOLOFT) 100 MG tablet Take 100 mg by mouth daily.     thyroid (ARMOUR) 90 MG tablet Take 90 mg by mouth daily.     No current facility-administered medications on file prior to visit.    No Known Allergies   Assessment/Plan:  1. Hypertension -  Patient blood pressure 128/70 in room which is reassuring. Concern regarding patients LEE and SOB however. Verified with Dr Servando Salina, will start furosemide 20mg  in morning. Last renal function normal. Has echo scheduled this afternoon. Requested patient contact clinic early next week with how she is feeling. F/u with Dr Servando Salina in 3 weeks.  Continue labetalol 200mg  BID Continue nifediine 60my daily Start furosemide 20mg  in morning  Laural Golden, PharmD, [place], CDCES, CPP [address] [place], [place], [address] Phone: [phone], Fax: [phone]

## 2023-02-28 NOTE — Patient Instructions (Signed)
It was nice meeting you this morning  Your blood pressure today looks great  Please continue your labetalol 200mg  twice a day and nifedipine 60mg  daily  I have sent in a prescription for a diuretic. Please take 1 tablet in the morning through the weekend. Give [place] a call or send a mychart message early next week with how you are feeling  Please let us know if you have any questions  Laural Golden, PharmD, BCACP, CDCES, CPP [address] [place], [place], [address] Phone: [phone], Fax: [phone]

## 2023-03-02 ENCOUNTER — Other Ambulatory Visit: Payer: Self-pay

## 2023-03-02 ENCOUNTER — Inpatient Hospital Stay (HOSPITAL_COMMUNITY)
Admission: AD | Admit: 2023-03-02 | Discharge: 2023-03-02 | Disposition: A | Discharge status: Home / Self Care | Payer: BC Managed Care – PPO | Attending: Obstetrics and Gynecology | Admitting: Obstetrics and Gynecology

## 2023-03-02 ENCOUNTER — Encounter (HOSPITAL_COMMUNITY): Payer: Self-pay | Admitting: Obstetrics and Gynecology

## 2023-03-02 DIAGNOSIS — Z3492 Encounter for supervision of normal pregnancy, unspecified, second trimester: Secondary | ICD-10-CM

## 2023-03-02 DIAGNOSIS — O99282 Endocrine, nutritional and metabolic diseases complicating pregnancy, second trimester: Secondary | ICD-10-CM | POA: Diagnosis not present

## 2023-03-02 DIAGNOSIS — E063 Autoimmune thyroiditis: Secondary | ICD-10-CM | POA: Diagnosis not present

## 2023-03-02 DIAGNOSIS — Z3A26 26 weeks gestation of pregnancy: Secondary | ICD-10-CM | POA: Insufficient documentation

## 2023-03-02 DIAGNOSIS — R609 Edema, unspecified: Secondary | ICD-10-CM

## 2023-03-02 DIAGNOSIS — O09292 Supervision of pregnancy with other poor reproductive or obstetric history, second trimester: Secondary | ICD-10-CM | POA: Insufficient documentation

## 2023-03-02 DIAGNOSIS — E038 Other specified hypothyroidism: Secondary | ICD-10-CM | POA: Insufficient documentation

## 2023-03-02 DIAGNOSIS — I10 Essential (primary) hypertension: Secondary | ICD-10-CM

## 2023-03-02 DIAGNOSIS — O10919 Unspecified pre-existing hypertension complicating pregnancy, unspecified trimester: Secondary | ICD-10-CM

## 2023-03-02 DIAGNOSIS — O09293 Supervision of pregnancy with other poor reproductive or obstetric history, third trimester: Secondary | ICD-10-CM

## 2023-03-02 DIAGNOSIS — O10012 Pre-existing essential hypertension complicating pregnancy, second trimester: Secondary | ICD-10-CM | POA: Diagnosis not present

## 2023-03-02 LAB — CBC
HCT: 31.3 % — ABNORMAL LOW (ref 36.0–46.0)
Hemoglobin: 10.8 g/dL — ABNORMAL LOW (ref 12.0–15.0)
MCH: 30.8 pg (ref 26.0–34.0)
MCHC: 34.5 g/dL (ref 30.0–36.0)
MCV: 89.2 fL (ref 80.0–100.0)
Platelets: 300 10*3/uL (ref 150–400)
RBC: 3.51 MIL/uL — ABNORMAL LOW (ref 3.87–5.11)
RDW: 13.1 % (ref 11.5–15.5)
WBC: 10.2 10*3/uL (ref 4.0–10.5)
nRBC: 0 % (ref 0.0–0.2)

## 2023-03-02 LAB — COMPREHENSIVE METABOLIC PANEL
ALT: 14 U/L (ref 0–44)
AST: 17 U/L (ref 15–41)
Albumin: 3.1 g/dL — ABNORMAL LOW (ref 3.5–5.0)
Alkaline Phosphatase: 80 U/L (ref 38–126)
Anion gap: 8 (ref 5–15)
BUN: 5 mg/dL — ABNORMAL LOW (ref 6–20)
CO2: 20 mmol/L — ABNORMAL LOW (ref 22–32)
Calcium: 8.6 mg/dL — ABNORMAL LOW (ref 8.9–10.3)
Chloride: 106 mmol/L (ref 98–111)
Creatinine, Ser: 0.65 mg/dL (ref 0.44–1.00)
GFR, Estimated: >60 mL/min (ref >60)
Glucose, Bld: 79 mg/dL (ref 70–99)
Potassium: 3.5 mmol/L (ref 3.5–5.1)
Sodium: 134 mmol/L — ABNORMAL LOW (ref 135–145)
Total Bilirubin: 0.4 mg/dL (ref 0.3–1.2)
Total Protein: 6.2 g/dL — ABNORMAL LOW (ref 6.5–8.1)

## 2023-03-02 LAB — URINALYSIS, ROUTINE W REFLEX MICROSCOPIC
Bilirubin Urine: NEGATIVE
Glucose, UA: NEGATIVE mg/dL
Hgb urine dipstick: NEGATIVE
Ketones, ur: NEGATIVE mg/dL
Leukocytes,Ua: NEGATIVE
Nitrite: NEGATIVE
Protein, ur: NEGATIVE mg/dL
Specific Gravity, Urine: 1.008 (ref 1.005–1.030)
pH: 6 (ref 5.0–8.0)

## 2023-03-02 LAB — BRAIN NATRIURETIC PEPTIDE: B Natriuretic Peptide: 58.9 pg/mL (ref 0.0–100.0)

## 2023-03-02 LAB — PROTEIN / CREATININE RATIO, URINE
Creatinine, Urine: 32 mg/dL
Total Protein, Urine: <6 mg/dL

## 2023-03-02 LAB — TSH: TSH: 2.171 u[IU]/mL (ref 0.350–4.500)

## 2023-03-02 MED ORDER — FUROSEMIDE 40 MG PO TABS: 40.0000 mg | ORAL_TABLET | Freq: Every day | ORAL | 0 refills | Status: DC

## 2023-03-02 NOTE — MAU Provider Note (Signed)
History     CSN: [id number]  Arrival date and time: 03/02/23 1401    Chief Complaint  Patient presents with   Swelling   BP Evaluation   Misty [name] is a 32 y.o. at [redacted]w[redacted]d here in MAU reporting: she has swelling in bilateral hands, ankles, and feet.  Also states took BP @ home today, BP 139/79.  Has a history of HTN and preeclampsia. Denies current HA, but had one yesterday.  Reports intermittent visual disturbances both yesterday and today. Reports visual dark spots and light flashes and that they have "been present for 20 years".  Denies epigastric pain.  Reports just starting Lasix yesterday, not noticing any difference.  Denies VB or LOF.  Reports +FM.    OB provider: [facility]  OB History     Gravida  2   Para  1   Term  1   Preterm      AB      Living  1      SAB      IAB      Ectopic      Multiple  0   Live Births  1           Past Medical History:  Diagnosis Date   Anemia    Asthma    COVID-19 07/14/2019   + 07/13/19   Hashimoto's disease 2021   Pregnancy induced hypertension     Past Surgical History:  Procedure Laterality Date   CESAREAN SECTION N/A 07/15/2019   Procedure: CESAREAN SECTION;  Surgeon: [name], MD;  Location: [facility] LD [facility]S;  Service: Obstetrics;  Laterality: N/A;   NO PAST SURGERIES      Family History  Adopted: Yes  Problem Relation Age of Onset   Hypertension Father     Social History   Tobacco Use   Smoking status: Former   Smokeless tobacco: Never  Vaping Use   Vaping status: Never Used  Substance Use Topics   Alcohol use: No   Drug use: No    Allergies: No Known Allergies  No medications prior to admission.    Review of Systems  Constitutional:  Positive for fatigue.  Eyes:  Positive for visual disturbance.  Respiratory:  Positive for shortness of breath.   Cardiovascular:  Positive for leg swelling. Negative for chest pain.  Endocrine: Negative for polyuria.  Genitourinary:   Negative for frequency and urgency.  Neurological:  Positive for weakness and headaches. Negative for seizures and facial asymmetry.   Physical Exam   Blood pressure (!) 146/67, pulse 82, temperature 98.4 F (36.9 C), temperature source Oral, resp. rate 17, height 5\' 5"  (1.651 m), weight 111.6 kg, SpO2 99%, unknown if currently breastfeeding.   Physical Exam Constitutional:      General: She is awake. She is not in acute distress.    Appearance: She is not ill-appearing.     Comments: Patient appears fatigued but not in acute distress. She is speaking in full sentences and no breathlessness  HENT:     Head: Normocephalic and atraumatic.  Neck:     Thyroid: No thyromegaly.     Vascular: No JVD.  Cardiovascular:     Rate and Rhythm: Normal rate and regular rhythm.     Pulses: Normal pulses.          Radial pulses are 2+ on the right side and 2+ on the left side.     Heart sounds: Murmur (2/6 systolic murmur) heard.  Pulmonary:  Effort: Pulmonary effort is normal.     Breath sounds: Normal breath sounds. No rales.  Abdominal:     General: There is no distension.     Palpations: Abdomen is soft.     Tenderness: There is no abdominal tenderness. There is no guarding.  Genitourinary:    Uterus: Enlarged.   Musculoskeletal:        General: Swelling present.     Right hand: Swelling present.     Left hand: Swelling present.     Right lower leg: No tenderness. 3+ Edema present.     Left lower leg: No tenderness. 3+ Edema present.     Right ankle: Swelling present. No tenderness.     Left ankle: Swelling present. No tenderness.     Right foot: Swelling present. No tenderness.     Left foot: Swelling present. No tenderness.  Skin:    General: Skin is warm and dry.  Neurological:     General: No focal deficit present.     Mental Status: She is alert and oriented to person, place, and time.  Psychiatric:        Mood and Affect: Mood normal.        Behavior: Behavior is  cooperative.     MAU Course  Procedures  .MDM: high  This patient presents to the ED for concern of   Chief Complaint  Patient presents with   Swelling   BP Evaluation     These complains involves an extensive number of treatment options, and is a complaint that carries with it a high risk of complications and morbidity.  The differential diagnosis for  1. Elevated blood pressure in pregnancy INCLUDES high risk conditions like preeclampsia or gestational hypertension, could also be transient spurious elevated blood pressure. Evaluation and monitoring necessary to determine. Patient is currently [redacted]w[redacted]d.   Co morbidities that complicate the patient evaluation: Patient Active Problem List   Diagnosis Date Noted   Adopted 02/28/2023   History of mood disorder 02/28/2023   Obesity 02/28/2023   Uterine leiomyoma 02/28/2023   Pregnancy-induced hypertension 02/28/2023   Pregnant 12/15/2022   Chronic hypertension 12/15/2022   Hypothyroidism 12/15/2022   Anxiety 05/14/2021   Asthma 05/14/2021   Hyperthyroidism with Hashimoto disease 09/06/2020     Additional history obtained from partner  External records from outside source obtained and reviewed including Scanned media records and Prenatal care records  Lab Tests: CMP, CBC, urine protein creatinine ratio, urinalysis, BNP, TSH, T3/T4  Recent Results (from the past 2160 hour(s))  Urinalysis, Routine w reflex microscopic -Urine, Clean Catch     Status: None   Collection Time: 12/14/22  3:36 PM  Result Value Ref Range   Color, Urine YELLOW YELLOW   APPearance CLEAR CLEAR   Specific Gravity, Urine 1.008 1.005 - 1.030   pH 6.0 5.0 - 8.0   Glucose, UA NEGATIVE NEGATIVE mg/dL   Hgb urine dipstick NEGATIVE NEGATIVE   Bilirubin Urine NEGATIVE NEGATIVE   Ketones, ur NEGATIVE NEGATIVE mg/dL   Protein, ur NEGATIVE NEGATIVE mg/dL   Nitrite NEGATIVE NEGATIVE   Leukocytes,Ua NEGATIVE NEGATIVE    Comment: Performed at [facility]  Lab, [address], [place], [place] [address]  Brain natriuretic peptide     Status: None   Collection Time: 12/14/22  4:27 PM  Result Value Ref Range   B Natriuretic Peptide 63.3 0.0 - 100.0 pg/mL    Comment: Performed at [facility] Lab, [address], [place], [place]  [address]  Comprehensive metabolic panel     Status: Abnormal   Collection Time: 12/14/22  4:27 PM  Result Value Ref Range   Sodium 136 135 - 145 mmol/L   Potassium 3.6 3.5 - 5.1 mmol/L   Chloride 106 98 - 111 mmol/L   CO2 22 22 - 32 mmol/L   Glucose, Bld 100 (H) 70 - 99 mg/dL    Comment: Glucose reference range applies only to samples taken after fasting for at least 8 hours.   BUN 7 6 - 20 mg/dL   Creatinine, Ser 6.04 0.44 - 1.00 mg/dL   Calcium 9.2 8.9 - 54.0 mg/dL   Total Protein 6.8 6.5 - 8.1 g/dL   Albumin 3.6 3.5 - 5.0 g/dL   AST 24 15 - 41 U/L   ALT 23 0 - 44 U/L   Alkaline Phosphatase 77 38 - 126 U/L   Total Bilirubin 0.4 0.3 - 1.2 mg/dL   GFR, Estimated >98 >11 mL/min    Comment: (NOTE) Calculated using the CKD-EPI Creatinine Equation (2021)    Anion gap 8 5 - 15    Comment: Performed at [facility], [address], [place], [place] [address]  CBC with Differential/Platelet     Status: Abnormal   Collection Time: 12/14/22  4:27 PM  Result Value Ref Range   WBC 7.5 4.0 - 10.5 K/uL   RBC 4.03 3.87 - 5.11 MIL/uL   Hemoglobin 12.5 12.0 - 15.0 g/dL   HCT 29.5 (L) 62.1 - 30.8 %   MCV 89.1 80.0 - 100.0 fL   MCH 31.0 26.0 - 34.0 pg   MCHC 34.8 30.0 - 36.0 g/dL   RDW 65.7 84.6 - 96.2 %   Platelets 264 150 - 400 K/uL   nRBC 0.0 0.0 - 0.2 %   Neutrophils Relative % 65 %   Neutro Abs 4.9 1.7 - 7.7 K/uL   Lymphocytes Relative 26 %   Lymphs Abs 2.0 0.7 - 4.0 K/uL   Monocytes Relative 7 %   Monocytes Absolute 0.5 0.1 - 1.0 K/uL   Eosinophils Relative 2 %   Eosinophils Absolute 0.1 0.0 - 0.5 K/uL   Basophils Relative 0 %   Basophils Absolute 0.0 0.0 - 0.1 K/uL   Immature Granulocytes 0 %   Abs  Immature Granulocytes 0.02 0.00 - 0.07 K/uL    Comment: Performed at [facility] Lab, [address], [place], [place] [address]  ECHOCARDIOGRAM COMPLETE     Status: None   Collection Time: 02/28/23  1:43 PM  Result Value Ref Range   Weight 3,990.4 oz   BP 128/70 mmHg   Area-P 1/2 3.60 cm2   S' Lateral 4.20 cm   Single Plane A2C EF 55.5 %   Single Plane A4C EF 54.6 %   Calc EF 55.0 %   Radius 0.40 cm   MV M vel 5.47 m/s   MV Peak grad 119.7 mmHg   Est EF 55 - 60%   Urinalysis, Routine w reflex microscopic -Urine, Clean Catch     Status: Abnormal   Collection Time: 03/02/23  2:55 PM  Result Value Ref Range   Color, Urine STRAW (A) YELLOW   APPearance CLEAR CLEAR   Specific Gravity, Urine 1.008 1.005 - 1.030   pH 6.0 5.0 - 8.0   Glucose, UA NEGATIVE NEGATIVE mg/dL   Hgb urine dipstick NEGATIVE NEGATIVE   Bilirubin Urine NEGATIVE NEGATIVE   Ketones, ur NEGATIVE NEGATIVE mg/dL   Protein, ur  NEGATIVE NEGATIVE mg/dL   Nitrite NEGATIVE NEGATIVE   Leukocytes,Ua NEGATIVE NEGATIVE    Comment: Performed at [facility] Lab, [address], [place], [place] [address]  Protein / creatinine ratio, urine     Status: None   Collection Time: 03/02/23  2:59 PM  Result Value Ref Range   Creatinine, Urine 32 mg/dL   Total Protein, Urine <6 mg/dL    Comment: NO NORMAL RANGE ESTABLISHED FOR THIS TEST   Protein Creatinine Ratio        0.00 - 0.15 mg/mg[Cre]    Comment: RESULT BELOW REPORTABLE RANGE, UNABLE TO CALCULATE. Performed at [facility] Lab, [address] [place], [place] [address]   Brain natriuretic peptide     Status: None   Collection Time: 03/02/23  3:05 PM  Result Value Ref Range   B Natriuretic Peptide 58.9 0.0 - 100.0 pg/mL    Comment: Performed at [facility] Lab, [address] [place], [place] [address]  CBC     Status: Abnormal   Collection Time: 03/02/23  3:07 PM  Result Value Ref Range   WBC 10.2 4.0 - 10.5 K/uL   RBC 3.51 (L) 3.87 - 5.11 MIL/uL    Hemoglobin 10.8 (L) 12.0 - 15.0 g/dL   HCT 15.1 (L) 76.1 - 60.7 %   MCV 89.2 80.0 - 100.0 fL   MCH 30.8 26.0 - 34.0 pg   MCHC 34.5 30.0 - 36.0 g/dL   RDW 37.1 06.2 - 69.4 %   Platelets 300 150 - 400 K/uL   nRBC 0.0 0.0 - 0.2 %    Comment: Performed at [facility] Lab, [address], [place], [place] [address]  Comprehensive metabolic panel     Status: Abnormal   Collection Time: 03/02/23  3:07 PM  Result Value Ref Range   Sodium 134 (L) 135 - 145 mmol/L   Potassium 3.5 3.5 - 5.1 mmol/L   Chloride 106 98 - 111 mmol/L   CO2 20 (L) 22 - 32 mmol/L   Glucose, Bld 79 70 - 99 mg/dL    Comment: Glucose reference range applies only to samples taken after fasting for at least 8 hours.   BUN 5 (L) 6 - 20 mg/dL   Creatinine, Ser 7.03 0.44 - 1.00 mg/dL   Calcium 8.6 (L) 8.9 - 10.3 mg/dL   Total Protein 6.2 (L) 6.5 - 8.1 g/dL   Albumin 3.1 (L) 3.5 - 5.0 g/dL   AST 17 15 - 41 U/L   ALT 14 0 - 44 U/L   Alkaline Phosphatase 80 38 - 126 U/L   Total Bilirubin 0.4 0.3 - 1.2 mg/dL   GFR, Estimated >50 >09 mL/min    Comment: (NOTE) Calculated using the CKD-EPI Creatinine Equation (2021)    Anion gap 8 5 - 15    Comment: Performed at [facility] Lab, [address] [place], [place] [address]  TSH     Status: None   Collection Time: 03/02/23  3:07 PM  Result Value Ref Range   TSH 2.171 0.350 - 4.500 uIU/mL    Comment: Performed by a 3rd Generation assay with a functional sensitivity of <=0.01 uIU/mL. Performed at [facility] Lab, [address], [place], [place] [address]      I ordered, and personally interpreted labs. The pertinent results include:    No results found for this or any previous visit (from the past 24 hour(s)).   Medicines ordered and prescription drug  management:  Medications: Lasix 20mg     Reevaluation of the patient after these medicines showed that the patient improved I have reviewed the patients home medicines and have made adjustments as needed  MAU  Course:  Lab work was unremarkable and BPs were mild range to normal  After the interventions noted above, I reevaluated the patient and found that they have: improved  Dispostion: discharged to home. Follow up with OB-Cardiology.   Assessment and Plan    1. Swelling   2. Hx of preeclampsia, prior pregnancy, currently pregnant, third trimester   3. Chronic hypertension affecting pregnancy   4. Hypothyroidism due to Hashimoto's thyroiditis   5. Gestational hypertension   6. Prenatal care in second trimester     Urinalysis showed no abnormalities, liver function, kidney function and electrolytes are reassuring. BNP shows patient is not acutely fluid overloaded, as does lack of pitting edema. Patient's Echo from Friday did not indicate HFrEF.   Swelling, fatigue, and elevated home Bps likely due to chronic hypertension in pregnancy rather than preeclampsia in this pregnancy. Continue to follow up with OB-Cardiology.   We will trial a higher dose of lasix (40mg ) to see if this improves UOP and swelling  Message sent per below to [facility] cardiology pharmacist  Discharged to home with instructions to follow up with Cardiology and reasons to return to MAU.    Lilly McGonegal 03/02/2023, 5:03 PM    Future Appointments  Date Time Provider Department Center  03/07/2023  1:40 PM Tobb, Lavona Mound, DO [facility] None  03/21/2023 10:20 AM Tobb, Lavona Mound, DO [facility] None    Attestation of Supervision of Student:  I confirm that I have verified the information documented in the physician assistant student's note and that I have also personally performed the history, physical exam and all medical decision making activities.  I have verified that all services and findings are accurately documented in this student's note; and I agree with management and plan as outlined in the documentation. I have also made any necessary editorial changes.    Federico Flake, MD [facility], [facility] 03/06/2023 12:56 PM

## 2023-03-02 NOTE — Discharge Instructions (Addendum)
Tomorrow take Lasix 40mg  daily for 3 days We will message the [facility] Cardiology Pharmacist to reach out to you about your symptoms Please follow up with Dr Rana Snare on 9/10

## 2023-03-02 NOTE — MAU Note (Signed)
Misty [name] is a 32 y.o. at [redacted]w[redacted]d here in MAU reporting: she has swelling in bilateral hands, ankles, and feet.  Also states took BP @ home today, BP 139/79.  Denies current HA, but had one yesterday.  Reports intermittent visual disturbances both yesterday and today.  Denies epigastric pain.  Reports just starting Lasix yesterday, not noticing any difference. Denies VB or LOF.  Reports +FM. LMP: NA Onset of complaint: Friday Pain score: 1 back Vitals:   03/02/23 1425  BP: (!) 141/74  Pulse: 85  Resp: 19  Temp: 98.4 F (36.9 C)  SpO2: 99%     FHT:142 bpm Lab orders placed from triage:   UA

## 2023-03-04 ENCOUNTER — Encounter: Payer: Self-pay | Admitting: Pharmacist

## 2023-03-04 ENCOUNTER — Telehealth: Payer: Self-pay | Admitting: Pharmacist

## 2023-03-04 ENCOUNTER — Telehealth: Payer: Self-pay | Admitting: Cardiology

## 2023-03-04 DIAGNOSIS — Z23 Encounter for immunization: Secondary | ICD-10-CM | POA: Diagnosis not present

## 2023-03-04 DIAGNOSIS — Z3480 Encounter for supervision of other normal pregnancy, unspecified trimester: Secondary | ICD-10-CM | POA: Diagnosis not present

## 2023-03-04 LAB — OB RESULTS CONSOLE RPR: RPR: NONREACTIVE

## 2023-03-04 LAB — OB RESULTS CONSOLE HIV ANTIBODY (ROUTINE TESTING): HIV: NONREACTIVE

## 2023-03-04 NOTE — Telephone Encounter (Signed)
Patient called back. Feels "worn out." Could not sing this weekend at church due to exhaustion. Has not noticed much increased urination. Currently increased to Lasix 40mg  at hospital. Will route to Dr Servando Salina

## 2023-03-04 NOTE — Telephone Encounter (Signed)
This encounter was created in error - please disregard.

## 2023-03-04 NOTE — Telephone Encounter (Signed)
-----   Message from Federico Flake sent at 03/02/2023  6:42 PM EDT ----- Regarding: Lasix Cristal Deer,  This patient was seen at the MAU for SOB and swelling. She did not appear fluid overloaded on exam but reported no increased UOP with Lasix 20mg . I increased her to Lasix 40mg  daily for 3 days.  Can you touch base with her about how she responded?  Dr Alvester Morin

## 2023-03-04 NOTE — Telephone Encounter (Signed)
Pt stated she just left her OBGYN appt to have her 26 weeks check up and lately she's been feeling extremely fatigue so her OB advised she contact her Cardiologist office to see what we'd advise her to do. She also stated she had an ECHO done on Friday but still hasn't heard anything so she'd requesting a callback to discuss things further. Please advise

## 2023-03-04 NOTE — Telephone Encounter (Signed)
Fatigue Starting on Friday- she saw the pharmacist on Friday and was given Lasix due to her extreme swelling. She has not really noticed much of a difference in her swelling. She also went to the hospital on Sunday due to the swelling, fatigue and shortness of breath. She just had an OB appt and her OB told her to call her cardiologist. She reports she is still swollen, she is short of breath with activity; also she has not heard about her Echo results yet.   (She saw Dr Rana Snare today at [facility])  She had lab work done on 03/02/23 while at hospital; and RBC and Hg were decreased. She reports that she had anemia with her last pregnancy and had to get iron infusions. She reports that Dr Rana Snare got lab work again today while at office visit to see if any changes since 03/02/23.    (There is also a note from Laural Golden, [facility]- please see note from today)   Told her that I would send this information to Dr Servando Salina and will be in touch with recommendations. Instructed patient to rest/relax in the meantime.

## 2023-03-04 NOTE — Telephone Encounter (Signed)
Called patient to see how she was doing on increased dose of Lasix. No answer, lmom

## 2023-03-05 NOTE — Telephone Encounter (Signed)
Called pt to set up appt. Appt was made for 1:40pm.

## 2023-03-07 ENCOUNTER — Encounter: Payer: Self-pay | Admitting: Cardiology

## 2023-03-07 ENCOUNTER — Ambulatory Visit (INDEPENDENT_AMBULATORY_CARE_PROVIDER_SITE_OTHER): Payer: BC Managed Care – PPO | Admitting: Cardiology

## 2023-03-07 VITALS — BP 142/80 | HR 86 | Ht 65.0 in | Wt 248.0 lb

## 2023-03-07 DIAGNOSIS — Z3A26 26 weeks gestation of pregnancy: Secondary | ICD-10-CM

## 2023-03-07 DIAGNOSIS — O10912 Unspecified pre-existing hypertension complicating pregnancy, second trimester: Secondary | ICD-10-CM

## 2023-03-07 DIAGNOSIS — I340 Nonrheumatic mitral (valve) insufficiency: Secondary | ICD-10-CM

## 2023-03-07 DIAGNOSIS — R600 Localized edema: Secondary | ICD-10-CM

## 2023-03-07 DIAGNOSIS — R5383 Other fatigue: Secondary | ICD-10-CM | POA: Diagnosis not present

## 2023-03-07 MED ORDER — TORSEMIDE 20 MG PO TABS: 20.0000 mg | ORAL_TABLET | Freq: Two times a day (BID) | ORAL | 3 refills | Status: DC

## 2023-03-07 MED ORDER — POTASSIUM CHLORIDE ER 10 MEQ PO TBCR
10.0000 meq | EXTENDED_RELEASE_TABLET | Freq: Every day | ORAL | 3 refills | Status: DC
Start: 2023-03-07 — End: 2023-05-27

## 2023-03-07 NOTE — Progress Notes (Signed)
Cardio-Obstetrics Clinic  Follow Up Note   Date:  03/07/2023   ID:  Channing Mutters, DOB Jan 09, 1991, MRN [id number]  PCP:  Pcp, No   [facility] Providers Cardiologist:  Thomasene Ripple, DO  Electrophysiologist:  None        Referring MD: No ref. provider found   Chief Complaint: " I am having worsening swollen leg"  History of Present Illness:    Misty [name] is a 32 y.o. female [G2P1001] who returns for follow up of worsening shortness of breath and leg swelling   Medical history includes gestational hypertension, Hashimoto's thyroid disease, obesity and mild to moderate regurgitation and PSVT .  Since her visit with me on February 07, 2023 at that time she was doing 2 weeks and hypertensive.  During the visit I increased her nifedipine to 60 mg daily.  I continued her labetalol 200 mg twice a day.  Since her visit she has seen crisper very well.  During his visit she has significant leg swelling Lasix was added to her regimen.  She had taken the Lasix.  Transiently this was increased by her OB to 40 mg daily.  She notes that she is not making any urine with the Lasix she is not getting any better shortness of breath is worsening.    Prior CV Studies Reviewed: The following studies were reviewed today: Reviewed Zio monitor and TTE   Past Medical History:  Diagnosis Date   Anemia    Asthma    COVID-19 07/14/2019   + 07/13/19   Hashimoto's disease 2021   Pregnancy induced hypertension     Past Surgical History:  Procedure Laterality Date   CESAREAN SECTION N/A 07/15/2019   Procedure: CESAREAN SECTION;  Surgeon: [name], MD;  Location: [facility] LD [facility]S;  Service: Obstetrics;  Laterality: N/A;   NO PAST SURGERIES        OB History     Gravida  2   Para  1   Term  1   Preterm      AB      Living  1      SAB      IAB      Ectopic      Multiple  0   Live Births  1               Current Medications: Current Meds  Medication Sig    aspirin EC 81 MG tablet Take 81 mg by mouth daily. Swallow whole.   Doxylamine-Pyridoxine 10-10 MG TBEC Take 2 tablets by mouth at bedtime.   labetalol (NORMODYNE) 200 MG tablet Take 200 mg by mouth 2 (two) times daily.   NIFEdipine (PROCARDIA XL/NIFEDICAL XL) 60 MG 24 hr tablet Take 1 tablet (60 mg total) by mouth daily.   potassium chloride (KLOR-CON) 10 MEQ tablet Take 1 tablet (10 mEq total) by mouth daily.   Prenatal Vit-Fe Fumarate-FA (PRENATAL MULTIVITAMIN) TABS tablet Take 1 tablet by mouth at bedtime.    sertraline (ZOLOFT) 100 MG tablet Take 100 mg by mouth daily.   thyroid (ARMOUR) 90 MG tablet Take 90 mg by mouth daily.   torsemide (DEMADEX) 20 MG tablet Take 1 tablet (20 mg total) by mouth 2 (two) times daily.     Allergies:   Patient has no known allergies.   Social History   Socioeconomic History   Marital status: Married    Spouse name: Tekeisha Grewal   Number of children: Not on file   Years  of education: Not on file   Highest education level: Not on file  Occupational History   Not on file  Tobacco Use   Smoking status: Former   Smokeless tobacco: Never  Vaping Use   Vaping status: Never Used  Substance and Sexual Activity   Alcohol use: No   Drug use: No   Sexual activity: Yes  Other Topics Concern   Not on file  Social History Narrative   Not on file   Social Determinants of Health   Financial Resource Strain: Low Risk  (06/19/2021)   Received from [facility], [facility]   Overall Financial Resource Strain (CARDIA)    Difficulty of Paying Living Expenses: Not hard at all  Food Insecurity: No Food Insecurity (07/30/2021)   Received from [facility], [facility]   Hunger Vital Sign    Worried About Running Out of Food in the Last Year: Never true    Ran Out of Food in the Last Year: Never true  Transportation Needs: No Transportation Needs (06/19/2021)   Received from [facility], [facility]   PRAPARE - Transportation    Lack of  Transportation (Medical): No    Lack of Transportation (Non-Medical): No  Physical Activity: Sufficiently Active (06/19/2021)   Received from [facility], [facility]   Exercise Vital Sign    Days of Exercise per Week: 5 days    Minutes of Exercise per Session: 150+ min  Stress: No Stress Concern Present (06/19/2021)   Received from [facility], [facility] of Occupational Health - Occupational Stress Questionnaire    Feeling of Stress : Only a little  Social Connections: Unknown (10/27/2021)   Received from [facility], [facility]   Social Network    Social Network: Not on file      Family History  Adopted: Yes  Problem Relation Age of Onset   Hypertension Father       ROS:   Please see the history of present illness.     All other systems reviewed and are negative.   Labs/EKG Reviewed:    EKG:   EKG was not ordered today.    Recent Labs: 03/02/2023: ALT 14; B Natriuretic Peptide 58.9; BUN 5; Creatinine, Ser 0.65; Hemoglobin 10.8; Platelets 300; Potassium 3.5; Sodium 134; TSH 2.171   Recent Lipid Panel No results found for: "CHOL", "TRIG", "HDL", "CHOLHDL", "LDLCALC", "LDLDIRECT"  Physical Exam:    VS:  BP (!) 142/80 (BP Location: Left Arm, Patient Position: Sitting, Cuff Size: Large)   Pulse 86   Ht 5\' 5"  (1.651 m)   Wt 248 lb (112.5 kg)   SpO2 100%   BMI 41.27 kg/m     Wt Readings from Last 3 Encounters:  03/07/23 248 lb (112.5 kg)  03/02/23 246 lb 1.6 oz (111.6 kg)  02/28/23 249 lb 6.4 oz (113.1 kg)     GEN:  Well nourished, well developed in no acute distress HEENT: Normal NECK: No JVD; No carotid bruits LYMPHATICS: No lymphadenopathy CARDIAC: RRR, no murmurs, rubs, gallops RESPIRATORY:  Clear to auscultation without rales, wheezing or rhonchi  ABDOMEN: Soft, non-tender, non-distended MUSCULOSKELETAL: +2 LE edema; No deformity  SKIN: Warm and dry NEUROLOGIC:  Alert and oriented x 3 PSYCHIATRIC:  Normal affect     Risk Assessment/Risk Calculators:     CARPREG II Risk Prediction Index Score:  1.  The patient's risk for a primary cardiac event is 5%.   Modified World Health Organization [facility]) Classification of Maternal  CV Risk   Class I         ASSESSMENT & PLAN:    Chronic hypertension in pregnancy Lower extremity edema Mild to moderate mitral regurgitation Fatigue [redacted] weeks gestation   Her blood pressure in the office today is within normal limits.  With the change her antihypertensive.  Unfortunately she still continues to have increasing swelling.  It did not tolerate the Lasix very well.  She prefers to try other diuretics that the Lasix.  Will give cautious torsemide hopefully this will help with the patient symptoms.  We reviewed her echocardiogram results in the office today. She did also have consultation, medical counseling with our pharmacist/cardioobstertric  pharmacist.  She will follow with valve closely.  In terms of her fatigue she has been started on iron for iron deficiency anemia hoping that this helped. In terms of the sleep apnea well we will plan for sleep study postpartum.  Patient Instructions  Medication Instructions:  Your physician has recommended you make the following change in your medication:  START: Torsemide (Demadex) 20 mg once daily START: Potassium 10 mEq once daily *If you need a refill on your cardiac medications before your next appointment, please call your pharmacy*   Lab Work: Your physician recommends that you have labs drawn today: Vit D If you have labs (blood work) drawn today and your tests are completely normal, you will receive your results only by: MyChart Message (if you have MyChart) OR A paper copy in the mail If you have any lab test that is abnormal or we need to change your treatment, we will call you to review the results.   Testing/Procedures: None   Follow-Up: At [facility], you and your health needs are  our priority.  As part of our continuing mission to provide you with exceptional heart care, we have created designated Provider Care Teams.  These Care Teams include your primary Cardiologist (physician) and Advanced Practice Providers (APPs -  Physician Assistants and Nurse Practitioners) who all work together to provide you with the care you need, when you need it.  Your next appointment:   4 week(s)  Provider:   Thomasene Ripple, DO [address], [place], [place] [address]   Other Instructions Please see [facility] - pharmacy in 2 weeks   Dispo:  No follow-ups on file.   Medication Adjustments/Labs and Tests Ordered: Current medicines are reviewed at length with the patient today.  Concerns regarding medicines are outlined above.  Tests Ordered: Orders Placed This Encounter  Procedures   VITAMIN D 25 Hydroxy (Vit-D Deficiency, Fractures)   Medication Changes: Meds ordered this encounter  Medications   torsemide (DEMADEX) 20 MG tablet    Sig: Take 1 tablet (20 mg total) by mouth 2 (two) times daily.    Dispense:  180 tablet    Refill:  3   potassium chloride (KLOR-CON) 10 MEQ tablet    Sig: Take 1 tablet (10 mEq total) by mouth daily.    Dispense:  90 tablet    Refill:  3

## 2023-03-07 NOTE — Patient Instructions (Addendum)
Medication Instructions:  Your physician has recommended you make the following change in your medication:  START: Torsemide (Demadex) 20 mg once daily START: Potassium 10 mEq once daily *If you need a refill on your cardiac medications before your next appointment, please call your pharmacy*   Lab Work: Your physician recommends that you have labs drawn today: Vit D If you have labs (blood work) drawn today and your tests are completely normal, you will receive your results only by: MyChart Message (if you have MyChart) OR A paper copy in the mail If you have any lab test that is abnormal or we need to change your treatment, we will call you to review the results.   Testing/Procedures: None   Follow-Up: At [facility], you and your health needs are our priority.  As part of our continuing mission to provide you with exceptional heart care, we have created designated Provider Care Teams.  These Care Teams include your primary Cardiologist (physician) and Advanced Practice Providers (APPs -  Physician Assistants and Nurse Practitioners) who all work together to provide you with the care you need, when you need it.  Your next appointment:   4 week(s)  Provider:   Thomasene Ripple, DO [address], [place], [place] [address]   Other Instructions Please see [facility] - pharmacy in 2 weeks

## 2023-03-11 NOTE — Telephone Encounter (Signed)
Pt seen Dr. Servando Salina 03/07/23.

## 2023-03-12 ENCOUNTER — Encounter: Payer: Self-pay | Admitting: Pharmacist

## 2023-03-14 ENCOUNTER — Encounter: Payer: Self-pay | Admitting: Pharmacist

## 2023-03-14 ENCOUNTER — Institutional Professional Consult (permissible substitution): Payer: BC Managed Care – PPO | Admitting: Pharmacist

## 2023-03-14 ENCOUNTER — Ambulatory Visit: Payer: BC Managed Care – PPO | Attending: Cardiovascular Disease | Admitting: Pharmacist

## 2023-03-14 VITALS — BP 128/78 | HR 82 | Wt 246.4 lb

## 2023-03-14 DIAGNOSIS — Z3A27 27 weeks gestation of pregnancy: Secondary | ICD-10-CM

## 2023-03-14 DIAGNOSIS — O10912 Unspecified pre-existing hypertension complicating pregnancy, second trimester: Secondary | ICD-10-CM | POA: Diagnosis not present

## 2023-03-14 NOTE — Progress Notes (Signed)
Patient ID: Araiah Krenz                 DOB: 1990-08-27                      MRN: [id number]     HPI: Misty [name] is a 32 y.o. female here for follow up in cardio/obstetrics. Was seen last Friday with complaints of fatigue, swelling, and HTN.   Furosemide was switched to torsemide at last visit and nifedipine was held to see if it was the cause of swelling.  Patient presents today for follow up. Has not taken torsemide this morning so swelling could be assessed. Still has significant edema although has lost 1.5#. Reports more frequent and urgent urination with torsemide as compared to furosemide. Has been taking in the evening before bed and is woken up to urinate.   Remains fatigued. Vitamin D level slightly low on lab work. Has ultrasound scheduled for Tuesday.   Started ferrous sulfate this week.  Current HTN meds:  Labetalol 200mg  BID Nifedipine 60mg  daily (currently holding) Torsemide 20mg  BID:   Wt Readings from Last 3 Encounters:  03/07/23 248 lb (112.5 kg)  03/02/23 246 lb 1.6 oz (111.6 kg)  02/28/23 249 lb 6.4 oz (113.1 kg)   BP Readings from Last 3 Encounters:  03/07/23 (!) 142/80  03/02/23 (!) 146/67  02/28/23 128/70   Pulse Readings from Last 3 Encounters:  03/07/23 86  03/02/23 82  02/28/23 84    Renal function: Estimated Creatinine Clearance: 126.2 mL/min (by C-G formula based on SCr of 0.65 mg/dL).  Past Medical History:  Diagnosis Date   Anemia    Asthma    COVID-19 07/14/2019   + 07/13/19   Hashimoto's disease 2021   Pregnancy induced hypertension     Current Outpatient Medications on File Prior to Visit  Medication Sig Dispense Refill   aspirin EC 81 MG tablet Take 81 mg by mouth daily. Swallow whole.     Doxylamine-Pyridoxine 10-10 MG TBEC Take 2 tablets by mouth at bedtime.     ferrous sulfate 324 MG TBEC Take 324 mg by mouth. 1 tablet 3x a week     labetalol (NORMODYNE) 200 MG tablet Take 200 mg by mouth 2 (two) times daily.      NIFEdipine (PROCARDIA XL/NIFEDICAL XL) 60 MG 24 hr tablet Take 1 tablet (60 mg total) by mouth daily. 90 tablet 3   potassium chloride (KLOR-CON) 10 MEQ tablet Take 1 tablet (10 mEq total) by mouth daily. 90 tablet 3   Prenatal Vit-Fe Fumarate-FA (PRENATAL MULTIVITAMIN) TABS tablet Take 1 tablet by mouth at bedtime.      sertraline (ZOLOFT) 100 MG tablet Take 100 mg by mouth daily.     thyroid (ARMOUR) 90 MG tablet Take 90 mg by mouth daily.     torsemide (DEMADEX) 20 MG tablet Take 1 tablet (20 mg total) by mouth 2 (two) times daily. 180 tablet 3   No current facility-administered medications on file prior to visit.    No Known Allergies   Assessment/Plan:  1. Hypertension -  Patient BP improved today at 128/78 despite holding nifedipine. Will continue to hold since BP at goal.   Recommended taking second dose of torsemide earlier in the evening so it does not wake her up to urinate. Still has LEE although 1.5# weight loss is promising.   Recommended starting OTC Vitamin D with food daily. Patient will contact clinic on Tuesday after appt for ultrasound.  Has follow up appt scheduled with Dr Servando Salina on 10/9.  Continue: Labetalol 200mg  BID Torsemide 20mg  BID:  Continue to hold nifedipine 60mg  as long as BP remains at goal F/u with Dr Servando Salina in 2 weeks  Laural Golden, PharmD, BCACP, CDCES, CPP [address], [address] [place], [place], [address] Phone: [phone], Fax: [phone]

## 2023-03-14 NOTE — Patient Instructions (Addendum)
Good seeing you again  Your blood pressure looks great today  Continue your labetalol 200mg  twice a day  You can continue to hold your nifedipine for the time being  Restart your torsemide 20mg  twice a day. Try to take it around 8am and 8pm  You can pick up OTC Vitamin D 1000IU at any grocery store or pharmacy  Try to watch how much salt you are eating  Let us know if you have any concerns  Laural Golden, PharmD, BCACP, CDCES, CPP [address], [address] [place], [place], [address] Phone: [phone], Fax: [phone]

## 2023-03-18 DIAGNOSIS — Z3A28 28 weeks gestation of pregnancy: Secondary | ICD-10-CM | POA: Diagnosis not present

## 2023-03-18 DIAGNOSIS — O10013 Pre-existing essential hypertension complicating pregnancy, third trimester: Secondary | ICD-10-CM | POA: Diagnosis not present

## 2023-03-21 ENCOUNTER — Ambulatory Visit: Payer: Commercial Managed Care - PPO | Admitting: Cardiology

## 2023-04-01 ENCOUNTER — Encounter: Payer: Self-pay | Admitting: Cardiology

## 2023-04-02 ENCOUNTER — Ambulatory Visit: Payer: BC Managed Care – PPO | Admitting: Cardiology

## 2023-05-15 ENCOUNTER — Encounter (HOSPITAL_COMMUNITY): Payer: Self-pay

## 2023-05-15 ENCOUNTER — Telehealth (HOSPITAL_COMMUNITY): Payer: Self-pay | Admitting: *Deleted

## 2023-05-15 NOTE — Patient Instructions (Addendum)
Misty [name]  05/15/2023   Your procedure is scheduled on:  05/29/2023  Arrive at 0530 at Entrance C on [employer] at [facility]  and [employer]. You are invited to use the FREE valet parking or use the Visitor's parking deck.  Pick up the phone at the desk and dial [phone].  Call this number if you have problems the morning of surgery: [phone]  Remember:   Do not eat food:(After Midnight) Desps de medianoche.  You may drink clear liquids until arrival at ___0530__.  Clear liquids means a liquid you can see thru.  It can have color such as Cola or Kool aid.  Tea is OK and coffee as long as no milk or creamer of any kind.  Take these medicines the morning of surgery with A SIP OF WATER:  Zoloft, Thyroid Armour and labetalol   Do not wear jewelry, make-up or nail polish.  Do not wear lotions, powders, or perfumes. Do not wear deodorant.  Do not shave 48 hours prior to surgery.  Do not bring valuables to the hospital.  [facility] is not   responsible for any belongings or valuables brought to the hospital.  Contacts, dentures or bridgework may not be worn into surgery.  Leave suitcase in the car. After surgery it may be brought to your room.  For patients admitted to the hospital, checkout time is 11:00 AM the day of              discharge.      Please read over the following fact sheets that you were given:     Preparing for Surgery

## 2023-05-15 NOTE — Telephone Encounter (Signed)
Preadmission screen  

## 2023-05-25 ENCOUNTER — Other Ambulatory Visit: Payer: Self-pay | Admitting: Cardiology

## 2023-05-25 DIAGNOSIS — I10 Essential (primary) hypertension: Secondary | ICD-10-CM

## 2023-05-25 DIAGNOSIS — Z3492 Encounter for supervision of normal pregnancy, unspecified, second trimester: Secondary | ICD-10-CM

## 2023-05-26 ENCOUNTER — Encounter (HOSPITAL_COMMUNITY)
Admission: RE | Admit: 2023-05-26 | Discharge: 2023-05-26 | Disposition: A | Discharge status: Home / Self Care | Payer: BC Managed Care – PPO | Source: Ambulatory Visit | Attending: Obstetrics and Gynecology | Admitting: Obstetrics and Gynecology

## 2023-05-26 DIAGNOSIS — Z98891 History of uterine scar from previous surgery: Secondary | ICD-10-CM | POA: Insufficient documentation

## 2023-05-26 DIAGNOSIS — Z01812 Encounter for preprocedural laboratory examination: Secondary | ICD-10-CM | POA: Insufficient documentation

## 2023-05-26 LAB — COMPREHENSIVE METABOLIC PANEL
ALT: 21 U/L (ref 0–44)
AST: 26 U/L (ref 15–41)
Albumin: 2.7 g/dL — ABNORMAL LOW (ref 3.5–5.0)
Alkaline Phosphatase: 126 U/L (ref 38–126)
Anion gap: 10 (ref 5–15)
BUN: 10 mg/dL (ref 6–20)
CO2: 19 mmol/L — ABNORMAL LOW (ref 22–32)
Calcium: 9.4 mg/dL (ref 8.9–10.3)
Chloride: 105 mmol/L (ref 98–111)
Creatinine, Ser: 0.74 mg/dL (ref 0.44–1.00)
GFR, Estimated: >60 mL/min (ref >60)
Glucose, Bld: 126 mg/dL — ABNORMAL HIGH (ref 70–99)
Potassium: 3.7 mmol/L (ref 3.5–5.1)
Sodium: 134 mmol/L — ABNORMAL LOW (ref 135–145)
Total Bilirubin: 0.5 mg/dL (ref <1.2)
Total Protein: 6.5 g/dL (ref 6.5–8.1)

## 2023-05-26 LAB — CBC
HCT: 34.1 % — ABNORMAL LOW (ref 36.0–46.0)
Hemoglobin: 11.5 g/dL — ABNORMAL LOW (ref 12.0–15.0)
MCH: 29.5 pg (ref 26.0–34.0)
MCHC: 33.7 g/dL (ref 30.0–36.0)
MCV: 87.4 fL (ref 80.0–100.0)
Platelets: 312 10*3/uL (ref 150–400)
RBC: 3.9 MIL/uL (ref 3.87–5.11)
RDW: 13.4 % (ref 11.5–15.5)
WBC: 10.2 10*3/uL (ref 4.0–10.5)
nRBC: 0 % (ref 0.0–0.2)

## 2023-05-26 LAB — TYPE AND SCREEN
ABO/RH(D): B POS
Antibody Screen: NEGATIVE

## 2023-05-26 LAB — RPR: RPR Ser Ql: NONREACTIVE

## 2023-05-28 NOTE — H&P (Signed)
Misty [name] is a 32 y.o. female presenting for repeat cesarean section. Pregnancy complicated by Hx of C/S and desires repeat. Chronic HTN followed by [facility] cardiology clinic currently on labetalol 200mg  TID. Hypothyroid followed by [facility]. Uterine fibroids. Mood disorder on sertraline. OB History     Gravida  2   Para  1   Term  1   Preterm      AB      Living  1      SAB      IAB      Ectopic      Multiple  0   Live Births  1          Past Medical History:  Diagnosis Date   Anemia    Asthma    COVID-19 07/14/2019   + 07/13/19   Hashimoto's disease 2021   Pregnancy induced hypertension    Past Surgical History:  Procedure Laterality Date   CESAREAN SECTION N/A 07/15/2019   Procedure: CESAREAN SECTION;  Surgeon: [name], MD;  Location: [facility] LD ORS;  Service: Obstetrics;  Laterality: N/A;   NO PAST SURGERIES     Family History: family history includes Hypertension in her father. She was adopted. Social History:  reports that she has quit smoking. She has never used smokeless tobacco. She reports that she does not drink alcohol and does not use drugs.     Maternal Diabetes: No Genetic Screening: Normal Maternal Ultrasounds/Referrals: Normal Fetal Ultrasounds or other Referrals:  None Maternal Substance Abuse:  No Significant Maternal Medications:  Meds include: Other: labetalol, sertraline, thyroid replacement Significant Maternal Lab Results:  Group B Strep positive Number of Prenatal Visits:greater than 3 verified prenatal visits Maternal Vaccinations: Other Comments:  None  Review of Systems  Constitutional:  Negative for fever.  Eyes:  Negative for visual disturbance.  Neurological:  Negative for headaches.   Maternal Medical History:  Fetal activity: Perceived fetal activity is normal.       unknown if currently breastfeeding. Maternal Exam:  Abdomen: Fetal presentation: vertex   Physical Exam Cardiovascular:      Rate and Rhythm: Normal rate.  Pulmonary:     Effort: Pulmonary effort is normal.     Prenatal labs: ABO, Rh: --/--/B POS (12/02 0948) Antibody: NEG (12/02 0948) Rubella:   RPR: NON REACTIVE (12/02 1000)  HBsAg:    HIV:    GBS:   positive urine culture 11/06/22  Assessment/Plan: 32 yo G3P1  CHTN Desires repeat cesarean section and tubal ligation D/W surgery and risks including infection, organ damage, bleeding/transfusion-HIV/Hep, DVT/PE, pneumonia, wound breakdown. Permanence of BTL and failure rate and increased ectopic risk.   Misty [name] II 05/28/2023, 6:04 PM

## 2023-05-29 ENCOUNTER — Inpatient Hospital Stay (HOSPITAL_COMMUNITY): Payer: BC Managed Care – PPO | Admitting: Anesthesiology

## 2023-05-29 ENCOUNTER — Encounter (HOSPITAL_COMMUNITY)
Admission: RE | Disposition: A | Discharge status: Home / Self Care | Payer: Self-pay | Source: Home / Self Care | Attending: Obstetrics and Gynecology

## 2023-05-29 ENCOUNTER — Other Ambulatory Visit: Payer: Self-pay

## 2023-05-29 ENCOUNTER — Encounter (HOSPITAL_COMMUNITY): Payer: Self-pay | Admitting: Obstetrics and Gynecology

## 2023-05-29 ENCOUNTER — Inpatient Hospital Stay (HOSPITAL_COMMUNITY)
Admission: RE | Admit: 2023-05-29 | Discharge: 2023-05-31 | DRG: 784 | Disposition: A | Discharge status: Home / Self Care | Payer: BC Managed Care – PPO | Attending: Obstetrics and Gynecology | Admitting: Obstetrics and Gynecology

## 2023-05-29 DIAGNOSIS — O99824 Streptococcus B carrier state complicating childbirth: Secondary | ICD-10-CM | POA: Diagnosis present

## 2023-05-29 DIAGNOSIS — Z302 Encounter for sterilization: Secondary | ICD-10-CM | POA: Diagnosis not present

## 2023-05-29 DIAGNOSIS — Z87891 Personal history of nicotine dependence: Secondary | ICD-10-CM | POA: Diagnosis not present

## 2023-05-29 DIAGNOSIS — Z98891 History of uterine scar from previous surgery: Principal | ICD-10-CM

## 2023-05-29 DIAGNOSIS — O3413 Maternal care for benign tumor of corpus uteri, third trimester: Secondary | ICD-10-CM | POA: Diagnosis present

## 2023-05-29 DIAGNOSIS — F39 Unspecified mood [affective] disorder: Secondary | ICD-10-CM | POA: Diagnosis present

## 2023-05-29 DIAGNOSIS — D252 Subserosal leiomyoma of uterus: Secondary | ICD-10-CM | POA: Diagnosis present

## 2023-05-29 DIAGNOSIS — E66813 Obesity, class 3: Secondary | ICD-10-CM | POA: Diagnosis present

## 2023-05-29 DIAGNOSIS — O34211 Maternal care for low transverse scar from previous cesarean delivery: Secondary | ICD-10-CM | POA: Diagnosis present

## 2023-05-29 DIAGNOSIS — Z8249 Family history of ischemic heart disease and other diseases of the circulatory system: Secondary | ICD-10-CM

## 2023-05-29 DIAGNOSIS — O1092 Unspecified pre-existing hypertension complicating childbirth: Secondary | ICD-10-CM | POA: Diagnosis present

## 2023-05-29 DIAGNOSIS — O9902 Anemia complicating childbirth: Secondary | ICD-10-CM | POA: Diagnosis present

## 2023-05-29 DIAGNOSIS — Z3490 Encounter for supervision of normal pregnancy, unspecified, unspecified trimester: Secondary | ICD-10-CM

## 2023-05-29 DIAGNOSIS — Z3A38 38 weeks gestation of pregnancy: Secondary | ICD-10-CM

## 2023-05-29 DIAGNOSIS — O99214 Obesity complicating childbirth: Secondary | ICD-10-CM | POA: Diagnosis present

## 2023-05-29 DIAGNOSIS — Z8616 Personal history of COVID-19: Secondary | ICD-10-CM

## 2023-05-29 DIAGNOSIS — O99284 Endocrine, nutritional and metabolic diseases complicating childbirth: Secondary | ICD-10-CM | POA: Diagnosis present

## 2023-05-29 DIAGNOSIS — E039 Hypothyroidism, unspecified: Secondary | ICD-10-CM | POA: Diagnosis present

## 2023-05-29 DIAGNOSIS — O99344 Other mental disorders complicating childbirth: Secondary | ICD-10-CM | POA: Diagnosis present

## 2023-05-29 DIAGNOSIS — O1002 Pre-existing essential hypertension complicating childbirth: Secondary | ICD-10-CM | POA: Diagnosis not present

## 2023-05-29 LAB — HIV ANTIBODY (ROUTINE TESTING W REFLEX): HIV Screen 4th Generation wRfx: NONREACTIVE

## 2023-05-29 SURGERY — Surgical Case
Anesthesia: Spinal | Site: Abdomen | Laterality: Bilateral

## 2023-05-29 MED ORDER — CEFAZOLIN SODIUM-DEXTROSE 2-4 GM/100ML-% IV SOLN
2.0000 g | INTRAVENOUS | Status: AC
  Administered 2023-05-29: 2 g via INTRAVENOUS

## 2023-05-29 MED ORDER — ACETAMINOPHEN 10 MG/ML IV SOLN
INTRAVENOUS | Status: AC
Start: 2023-05-29 — End: ?
  Filled 2023-05-29: qty 100

## 2023-05-29 MED ORDER — DIPHENHYDRAMINE HCL 25 MG PO CAPS: 25.0000 mg | ORAL_CAPSULE | Freq: Four times a day (QID) | ORAL | Status: DC | PRN

## 2023-05-29 MED ORDER — MORPHINE SULFATE (PF) 0.5 MG/ML IJ SOLN
INTRAMUSCULAR | Status: AC
  Filled 2023-05-29: qty 10

## 2023-05-29 MED ORDER — DIPHENHYDRAMINE HCL 50 MG/ML IJ SOLN: 12.5000 mg | Freq: Four times a day (QID) | INTRAMUSCULAR | Status: DC | PRN

## 2023-05-29 MED ORDER — DEXAMETHASONE SODIUM PHOSPHATE 10 MG/ML IJ SOLN
INTRAMUSCULAR | Status: AC
  Filled 2023-05-29: qty 1

## 2023-05-29 MED ORDER — NALOXONE HCL 4 MG/10ML IJ SOLN: 1.0000 ug/kg/h | INTRAVENOUS | Status: DC | PRN

## 2023-05-29 MED ORDER — SIMETHICONE 80 MG PO CHEW: 80.0000 mg | CHEWABLE_TABLET | ORAL | Status: DC | PRN

## 2023-05-29 MED ORDER — MORPHINE SULFATE (PF) 0.5 MG/ML IJ SOLN
INTRAMUSCULAR | Status: DC | PRN
  Administered 2023-05-29: 150 ug via INTRATHECAL

## 2023-05-29 MED ORDER — BUPIVACAINE IN DEXTROSE 0.75-8.25 % IT SOLN
INTRATHECAL | Status: DC | PRN
  Administered 2023-05-29: 1.6 mL via INTRATHECAL

## 2023-05-29 MED ORDER — HYDROMORPHONE HCL 1 MG/ML IJ SOLN: 0.2000 mg | INTRAMUSCULAR | Status: DC | PRN

## 2023-05-29 MED ORDER — LABETALOL HCL 200 MG PO TABS
200.0000 mg | ORAL_TABLET | Freq: Three times a day (TID) | ORAL | Status: DC
  Administered 2023-05-29 – 2023-05-31 (×7): 200 mg via ORAL
  Filled 2023-05-29 (×7): qty 1

## 2023-05-29 MED ORDER — ZOLPIDEM TARTRATE 5 MG PO TABS: 5.0000 mg | ORAL_TABLET | Freq: Every evening | ORAL | Status: DC | PRN

## 2023-05-29 MED ORDER — OXYCODONE HCL 5 MG/5ML PO SOLN: 5.0000 mg | Freq: Once | ORAL | Status: DC | PRN

## 2023-05-29 MED ORDER — OXYCODONE HCL 5 MG PO TABS: 5.0000 mg | ORAL_TABLET | Freq: Once | ORAL | Status: DC | PRN

## 2023-05-29 MED ORDER — OXYCODONE HCL 5 MG PO TABS
5.0000 mg | ORAL_TABLET | ORAL | Status: DC | PRN
  Administered 2023-05-31: 5 mg via ORAL
  Filled 2023-05-29: qty 1

## 2023-05-29 MED ORDER — PRENATAL MULTIVITAMIN CH
1.0000 | ORAL_TABLET | Freq: Every day | ORAL | Status: DC
Start: 2023-05-29 — End: 2023-05-31
  Administered 2023-05-30 – 2023-05-31 (×2): 1 via ORAL
  Filled 2023-05-29 (×2): qty 1

## 2023-05-29 MED ORDER — MENTHOL 3 MG MT LOZG: 1.0000 | LOZENGE | OROMUCOSAL | Status: DC | PRN

## 2023-05-29 MED ORDER — FENTANYL CITRATE (PF) 100 MCG/2ML IJ SOLN
INTRAMUSCULAR | Status: AC
  Filled 2023-05-29: qty 2

## 2023-05-29 MED ORDER — ONDANSETRON HCL 4 MG/2ML IJ SOLN
INTRAMUSCULAR | Status: AC
  Filled 2023-05-29: qty 2

## 2023-05-29 MED ORDER — NALOXONE HCL 0.4 MG/ML IJ SOLN: 0.4000 mg | INTRAMUSCULAR | Status: DC | PRN

## 2023-05-29 MED ORDER — ONDANSETRON HCL 4 MG/2ML IJ SOLN
INTRAMUSCULAR | Status: DC | PRN
  Administered 2023-05-29: 4 mg via INTRAVENOUS

## 2023-05-29 MED ORDER — ONDANSETRON HCL 4 MG/2ML IJ SOLN: 4.0000 mg | Freq: Three times a day (TID) | INTRAMUSCULAR | Status: DC | PRN

## 2023-05-29 MED ORDER — PHENYLEPHRINE HCL-NACL 20-0.9 MG/250ML-% IV SOLN
INTRAVENOUS | Status: DC | PRN
  Administered 2023-05-29: 60 ug/min via INTRAVENOUS

## 2023-05-29 MED ORDER — KETOROLAC TROMETHAMINE 30 MG/ML IJ SOLN: 30.0000 mg | Freq: Once | INTRAMUSCULAR | Status: DC | PRN

## 2023-05-29 MED ORDER — MEPERIDINE HCL 25 MG/ML IJ SOLN: 6.2500 mg | INTRAMUSCULAR | Status: DC | PRN

## 2023-05-29 MED ORDER — POVIDONE-IODINE 10 % EX SWAB
2.0000 | Freq: Once | CUTANEOUS | Status: AC
  Administered 2023-05-29: 2 via TOPICAL

## 2023-05-29 MED ORDER — WITCH HAZEL-GLYCERIN EX PADS: 1.0000 | MEDICATED_PAD | CUTANEOUS | Status: DC | PRN

## 2023-05-29 MED ORDER — OXYTOCIN-SODIUM CHLORIDE 30-0.9 UT/500ML-% IV SOLN
INTRAVENOUS | Status: AC
  Filled 2023-05-29: qty 1000

## 2023-05-29 MED ORDER — COCONUT OIL OIL: 1.0000 | TOPICAL_OIL | Status: DC | PRN

## 2023-05-29 MED ORDER — SOD CITRATE-CITRIC ACID 500-334 MG/5ML PO SOLN
30.0000 mL | ORAL | Status: AC
  Administered 2023-05-29: 30 mL via ORAL

## 2023-05-29 MED ORDER — SODIUM CHLORIDE 0.9 % IR SOLN
Status: DC | PRN
  Administered 2023-05-29: 1000 mL

## 2023-05-29 MED ORDER — STERILE WATER FOR IRRIGATION IR SOLN
Status: DC | PRN
  Administered 2023-05-29: 1000 mL

## 2023-05-29 MED ORDER — SOD CITRATE-CITRIC ACID 500-334 MG/5ML PO SOLN
ORAL | Status: AC
Start: 2023-05-29 — End: ?
  Filled 2023-05-29: qty 30

## 2023-05-29 MED ORDER — AMISULPRIDE (ANTIEMETIC) 5 MG/2ML IV SOLN: 10.0000 mg | Freq: Once | INTRAVENOUS | Status: DC | PRN

## 2023-05-29 MED ORDER — CEFAZOLIN SODIUM-DEXTROSE 2-4 GM/100ML-% IV SOLN
INTRAVENOUS | Status: AC
  Filled 2023-05-29: qty 100

## 2023-05-29 MED ORDER — DEXAMETHASONE SODIUM PHOSPHATE 10 MG/ML IJ SOLN
INTRAMUSCULAR | Status: DC | PRN
  Administered 2023-05-29: 10 mg via INTRAVENOUS

## 2023-05-29 MED ORDER — FENTANYL CITRATE (PF) 100 MCG/2ML IJ SOLN
INTRAMUSCULAR | Status: DC | PRN
  Administered 2023-05-29: 15 ug via INTRATHECAL

## 2023-05-29 MED ORDER — PHENYLEPHRINE HCL-NACL 20-0.9 MG/250ML-% IV SOLN
INTRAVENOUS | Status: AC
Start: 2023-05-29 — End: ?
  Filled 2023-05-29: qty 250

## 2023-05-29 MED ORDER — OXYTOCIN-SODIUM CHLORIDE 30-0.9 UT/500ML-% IV SOLN: 2.5000 [IU]/h | INTRAVENOUS | Status: AC

## 2023-05-29 MED ORDER — ACETAMINOPHEN 10 MG/ML IV SOLN
INTRAVENOUS | Status: DC | PRN
  Administered 2023-05-29: 1000 mg via INTRAVENOUS

## 2023-05-29 MED ORDER — DIBUCAINE (PERIANAL) 1 % EX OINT: 1.0000 | TOPICAL_OINTMENT | CUTANEOUS | Status: DC | PRN

## 2023-05-29 MED ORDER — SERTRALINE HCL 100 MG PO TABS
100.0000 mg | ORAL_TABLET | Freq: Every morning | ORAL | Status: DC
  Administered 2023-05-30 – 2023-05-31 (×2): 100 mg via ORAL
  Filled 2023-05-29 (×2): qty 1

## 2023-05-29 MED ORDER — SENNOSIDES-DOCUSATE SODIUM 8.6-50 MG PO TABS
2.0000 | ORAL_TABLET | Freq: Every day | ORAL | Status: DC
  Administered 2023-05-30 – 2023-05-31 (×2): 2 via ORAL
  Filled 2023-05-29 (×2): qty 2

## 2023-05-29 MED ORDER — SIMETHICONE 80 MG PO CHEW
80.0000 mg | CHEWABLE_TABLET | Freq: Three times a day (TID) | ORAL | Status: DC
  Administered 2023-05-29 – 2023-05-31 (×5): 80 mg via ORAL
  Filled 2023-05-29 (×5): qty 1

## 2023-05-29 MED ORDER — SODIUM CHLORIDE 0.9 % IV SOLN
500.0000 mg | INTRAVENOUS | Status: DC
  Administered 2023-05-29: 500 mg via INTRAVENOUS

## 2023-05-29 MED ORDER — SODIUM CHLORIDE 0.9% FLUSH: 3.0000 mL | INTRAVENOUS | Status: DC | PRN

## 2023-05-29 MED ORDER — FENTANYL CITRATE (PF) 100 MCG/2ML IJ SOLN: 25.0000 ug | INTRAMUSCULAR | Status: DC | PRN

## 2023-05-29 MED ORDER — SCOPOLAMINE 1 MG/3DAYS TD PT72
1.0000 | MEDICATED_PATCH | Freq: Once | TRANSDERMAL | Status: DC
  Filled 2023-05-29 (×2): qty 1

## 2023-05-29 MED ORDER — ACETAMINOPHEN 500 MG PO TABS
1000.0000 mg | ORAL_TABLET | Freq: Four times a day (QID) | ORAL | Status: DC
Start: 2023-05-29 — End: 2023-05-31
  Administered 2023-05-29 – 2023-05-31 (×7): 1000 mg via ORAL
  Filled 2023-05-29 (×8): qty 2

## 2023-05-29 MED ORDER — LACTATED RINGERS IV SOLN: INTRAVENOUS | Status: DC

## 2023-05-29 MED ORDER — OXYTOCIN-SODIUM CHLORIDE 30-0.9 UT/500ML-% IV SOLN
INTRAVENOUS | Status: DC | PRN
  Administered 2023-05-29: 300 mL via INTRAVENOUS

## 2023-05-29 MED ORDER — THYROID 60 MG PO TABS
90.0000 mg | ORAL_TABLET | Freq: Every day | ORAL | Status: DC
  Administered 2023-05-30 – 2023-05-31 (×2): 90 mg via ORAL
  Filled 2023-05-29 (×2): qty 1

## 2023-05-29 MED ORDER — LACTATED RINGERS IV SOLN: INTRAVENOUS | Status: AC

## 2023-05-29 MED ORDER — IBUPROFEN 600 MG PO TABS
600.0000 mg | ORAL_TABLET | Freq: Four times a day (QID) | ORAL | Status: DC | PRN
  Administered 2023-05-29 – 2023-05-31 (×3): 600 mg via ORAL
  Filled 2023-05-29 (×3): qty 1

## 2023-05-29 SURGICAL SUPPLY — 33 items
BENZOIN TINCTURE PRP APPL 2/3 (GAUZE/BANDAGES/DRESSINGS) IMPLANT
CHLORAPREP W/TINT 26 (MISCELLANEOUS) ×2 IMPLANT
CLAMP UMBILICAL CORD (MISCELLANEOUS) ×1 IMPLANT
CLOTH BEACON ORANGE TIMEOUT ST (SAFETY) ×1 IMPLANT
DERMABOND ADVANCED .7 DNX12 (GAUZE/BANDAGES/DRESSINGS) IMPLANT
DRSG OPSITE POSTOP 4X10 (GAUZE/BANDAGES/DRESSINGS) ×1 IMPLANT
ELECT REM PT RETURN 9FT ADLT (ELECTROSURGICAL) ×1
ELECTRODE REM PT RTRN 9FT ADLT (ELECTROSURGICAL) ×1 IMPLANT
EXTRACTOR VACUUM M CUP 4 TUBE (SUCTIONS) IMPLANT
GLOVE BIO SURGEON STRL SZ7.5 (GLOVE) ×1 IMPLANT
GLOVE BIOGEL PI IND STRL 7.0 (GLOVE) ×1 IMPLANT
GOWN SRG XL 47XLVL 4 REINF (GOWN DISPOSABLE) ×1 IMPLANT
GOWN STRL REUS W/TWL LRG LVL3 (GOWN DISPOSABLE) ×1 IMPLANT
KIT ABG SYR 3ML LUER SLIP (SYRINGE) ×1 IMPLANT
MAT PREVALON FULL STRYKER (MISCELLANEOUS) IMPLANT
NDL HYPO 25X5/8 SAFETYGLIDE (NEEDLE) ×1 IMPLANT
NEEDLE HYPO 22GX1.5 SAFETY (NEEDLE) ×1 IMPLANT
NEEDLE HYPO 25X5/8 SAFETYGLIDE (NEEDLE) ×1 IMPLANT
NS IRRIG 1000ML POUR BTL (IV SOLUTION) ×1 IMPLANT
PACK C SECTION WH (CUSTOM PROCEDURE TRAY) ×1 IMPLANT
PAD OB MATERNITY 4.3X12.25 (PERSONAL CARE ITEMS) ×1 IMPLANT
RETRACTOR TRAXI PANNICULUS (MISCELLANEOUS) IMPLANT
STRIP CLOSURE SKIN 1/2X4 (GAUZE/BANDAGES/DRESSINGS) IMPLANT
SUT CHROMIC 2 0 SH (SUTURE) IMPLANT
SUT MNCRL 0 VIOLET CTX 36 (SUTURE) ×4 IMPLANT
SUT PDS AB 0 CTX 60 (SUTURE) ×1 IMPLANT
SUT PLAIN 0 NONE (SUTURE) IMPLANT
SUT PLAIN 2 0 XLH (SUTURE) ×1 IMPLANT
SUT PLAIN ABS 2-0 CT1 27XMFL (SUTURE) IMPLANT
SUT VIC AB 4-0 KS 27 (SUTURE) ×1 IMPLANT
TOWEL OR 17X24 6PK STRL BLUE (TOWEL DISPOSABLE) ×1 IMPLANT
TRAY FOLEY W/BAG SLVR 14FR LF (SET/KITS/TRAYS/PACK) ×1 IMPLANT
WATER STERILE IRR 1000ML POUR (IV SOLUTION) ×1 IMPLANT

## 2023-05-29 NOTE — Op Note (Signed)
Misty [name], [facility] MEDICAL RECORD NO: [id number] ACCOUNT NO: [id number] DATE OF BIRTH: Oct 28, 1990 FACILITY: [facility] LOCATION: [id number] PHYSICIAN: Guy Sandifer. Arleta Creek, MD  Operative Report   DATE OF PROCEDURE: 05/29/2023  PREOPERATIVE DIAGNOSES: 1.  Desires repeat cesarean section. 2.  Desires permanent sterilization.  POSTOPERATIVE DIAGNOSES: 1.  Desires repeat cesarean section. 2.  Desires permanent sterilization.  PROCEDURE:  Repeat low transverse cesarean section and bilateral tubal ligation.  SURGEON:  Guy Sandifer. Arleta Creek, MD  ASSISTANT:  Mittie Bodo, MD  An experienced assistant was required given the standard of surgical care given the complexity of the case.  This assistant was needed for exposure, dissection, suctioning, retraction, instrument exchange, assisting with delivery with administration of fundal pressure, and for overall help during the procedure    ANESTHESIA:  Spinal.  FINDINGS:  Viable female infant.  Apgars, arterial cord pH, birth weight pending.  ESTIMATED BLOOD LOSS:  Per anesthesiology note.  SPECIMENS:  Bilateral fallopian tube segments to pathology.  INDICATIONS AND CONSENT:  This patient is a 32 year old G2, P1 at 32 and 5/7 weeks who has a history of previous cesarean section.  She desires repeat.  She also desires permanent sterilization.  Risks and complications have been discussed including, but  not limited to infection, organ damage, bleeding requiring transfusion of blood products with HIV and hepatitis acquisition, DVT, PE, pneumonia, and wound breakdown, permanence of tubal ligation, alternative contraception, failure rate, and increased  ectopic risk have been discussed.  She states she understands and agrees and consent was signed on the chart.  DESCRIPTION OF PROCEDURE:  The patient was taken to the operating room where she was identified and spinal anesthetic was placed per Anesthesiology and she was placed in the dorsal supine position with  a 15-degree left lateral wedge.  She was prepped  vaginally with Betadine.  Foley catheter was placed and the bladder was drained and she was prepped abdominally with ChloraPrep.  She has several small superficial excoriations on her abdomen and some mild erythema secondary to the wipes that had been  done in the preop area.  A traxi was applied, which effectively covers these.  She was then prepped vaginally with Betadine.  Foley catheter was then placed and she was prepped abdominally with ChloraPrep.  Timeout was done.  After a 3-minute drying  time, she was draped in a sterile fashion.  After testing for adequate spinal anesthesia, skin was entered through the Pfannenstiel scar and dissection was carried out in layers to the peritoneum, which was taken down superiorly and inferiorly.   Vesicouterine peritoneum was taken down bilaterally and bladder flaps developed.  Uterus was incised in a low transverse manner and the uterine cavity was entered bluntly with a hemostat.  Clear fluid was noted.  The incision was extended bilaterally  with the fingers.  Babies delivered from the vertex position and the remainder of the baby is delivered without difficulty.  Heart rate remains over 100, but due to poor respiratory effort, the cord is clamped and cut prior to the one-minute time and the  baby is handed to the pediatrics team.  Placenta is delivered and the cavity is clean.  Uterus was closed in two running locking imbricating layers of 0 Monocryl suture.  The bladder flap was also reapproximated in running fashion with a 2-0 chromic  suture.  The left fallopian tube was identified from cornea to fimbria.  It was grasped in its mid ampullary portion with a Babcock and a knuckle of  tube was doubly ligated with two free ties.  The intervening knuckle was sharply resected and cautery was  used to assure hemostasis.  Similar procedure was carried out on the right.  Multiple subserosal fibroids ranging from 1 to  2.5 cm were noted.  Lavage was carried out.  Anterior peritoneum was closed in a running fashion with 0 Monocryl suture, which  was also used to reapproximate the pyramidalis muscle in the midline.  Anterior rectus fascia was closed in a running fashion with a 0 looped PDS taking generous bites.  Subcutaneous layers were closed with interrupted plane and the skin was closed in a  subcuticular fashion with 4-0 Vicryl and a Keith needle.  Benzoin, Steri-Strips, and honeycomb were applied.  All counts were correct.  The patient was taken to the recovery room in stable condition.   PUS D: 05/29/2023 8:54:38 am T: 05/29/2023 9:24:00 am  JOB: [id number]/ [id number]

## 2023-05-29 NOTE — Anesthesia Preprocedure Evaluation (Addendum)
Anesthesia Evaluation  Patient identified by MRN, date of birth, ID band Patient awake    Reviewed: Allergy & Precautions, NPO status , Patient's Chart, lab work & pertinent test results  Airway Mallampati: III       Dental  (+) Upper Dentures   Pulmonary asthma , former smoker   Pulmonary exam normal        Cardiovascular hypertension, Pt. on home beta blockers and Pt. on medications Normal cardiovascular exam     Neuro/Psych  PSYCHIATRIC DISORDERS Anxiety     negative neurological ROS     GI/Hepatic negative GI ROS, Neg liver ROS,,,  Endo/Other  Hypothyroidism  Class 3 obesity  Renal/GU negative Renal ROS     Musculoskeletal negative musculoskeletal ROS (+)    Abdominal  (+) + obese  Peds  Hematology  (+) Blood dyscrasia, anemia Plt: 312   Anesthesia Other Findings CHTN  PREVIOUS C-S X 1  DESIRES STERILITY    Reproductive/Obstetrics (+) Pregnancy                             Anesthesia Physical Anesthesia Plan  ASA: 3  Anesthesia Plan: Spinal   Post-op Pain Management:    Induction:   PONV Risk Score and Plan: 2 and Ondansetron, Dexamethasone and Treatment may vary due to age or medical condition  Airway Management Planned: Natural Airway  Additional Equipment:   Intra-op Plan:   Post-operative Plan:   Informed Consent: I have reviewed the patients History and Physical, chart, labs and discussed the procedure including the risks, benefits and alternatives for the proposed anesthesia with the patient or authorized representative who has indicated his/her understanding and acceptance.     Dental advisory given  Plan Discussed with: CRNA  Anesthesia Plan Comments:         Anesthesia Quick Evaluation

## 2023-05-29 NOTE — Anesthesia Procedure Notes (Signed)
Spinal  Patient location during procedure: OR Start time: 05/29/2023 7:25 AM End time: 05/29/2023 7:32 AM Reason for block: surgical anesthesia Staffing Performed: anesthesiologist  Anesthesiologist: Leonides Grills, MD Performed by: Leonides Grills, MD Authorized by: Leonides Grills, MD   Preanesthetic Checklist Completed: patient identified, IV checked, risks and benefits discussed, surgical consent, monitors and equipment checked, pre-op evaluation and timeout performed Spinal Block Patient position: sitting Prep: DuraPrep Patient monitoring: cardiac monitor, continuous pulse ox and blood pressure Approach: midline Location: L4-5 Injection technique: single-shot Needle Needle type: Pencan  Needle gauge: 24 G Needle length: 9 cm Assessment Sensory level: T10 Events: CSF return Additional Notes Functioning IV was confirmed and monitors were applied. Sterile prep and drape, including hand hygiene and sterile gloves were used. The patient was positioned and the spine was prepped. The skin was anesthetized with lidocaine.  Free flow of clear CSF was obtained prior to injecting local anesthetic into the CSF.  The spinal needle aspirated freely following injection.  The needle was carefully withdrawn.  The patient tolerated the procedure well.

## 2023-05-29 NOTE — Transfer of Care (Signed)
Immediate Anesthesia Transfer of Care Note  Patient: Misty [name]  Procedure(s) Performed: REPEAT CESAREAN SECTION WITH BILATERAL TUBAL LIGATION EDC: 06-07-23  ALLERG: NKDA  PREVIOUS X 1 (Bilateral: Abdomen)  Patient Location: PACU  Anesthesia Type:Spinal  Level of Consciousness: awake, alert , and oriented  Airway & Oxygen Therapy: Patient Spontanous Breathing  Post-op Assessment: Report given to RN and Post -op Vital signs reviewed and stable  Post vital signs: Reviewed and stable  Last Vitals:  Vitals Value Taken Time  BP 124/62 05/29/23 0902  Temp    Pulse 83 05/29/23 0904  Resp 20 05/29/23 0904  SpO2 98 % 05/29/23 0904  Vitals shown include unfiled device data.  Last Pain:  Vitals:   05/29/23 0600  TempSrc: Oral  PainSc: 5          Complications: No notable events documented.

## 2023-05-29 NOTE — Brief Op Note (Signed)
05/29/2023  8:48 AM  PATIENT:  Misty [name]  32 y.o. female  PRE-OPERATIVE DIAGNOSIS:  CHTN  PREVIOUS X 1  DESIRES STERILITY  POST-OPERATIVE DIAGNOSIS:  CHTN PREVIOUS X 1 DESIRES STERILITY  PROCEDURE:  Procedure(s): REPEAT CESAREAN SECTION WITH BILATERAL TUBAL LIGATION EDC: 06-07-23  ALLERG: NKDA  PREVIOUS X 1 (Bilateral)  SURGEON:  Surgeons and Role:    * [name], MD - Primary    * Hessie Dibble, MD - Fellow  PHYSICIAN ASSISTANT:   ASSISTANTS:   ANESTHESIA:   spinal  EBL:    BLOOD ADMINISTERED:none  DRAINS: Urinary Catheter (Foley)   LOCAL MEDICATIONS USED:  NONE  SPECIMEN:  Source of Specimen:  bilateral fallopian tube segments  DISPOSITION OF SPECIMEN:  PATHOLOGY  COUNTS:  YES  TOURNIQUET:  * No tourniquets in log *  DICTATION: .Other Dictation: Dictation Number [id number]  PLAN OF CARE: Admit to inpatient   PATIENT DISPOSITION:  PACU - hemodynamically stable.   Delay start of Pharmacological VTE agent (>24hrs) due to surgical blood loss or risk of bleeding: not applicable

## 2023-05-29 NOTE — Anesthesia Postprocedure Evaluation (Signed)
Anesthesia Post Note  Patient: Channing Mutters  Procedure(s) Performed: REPEAT CESAREAN SECTION WITH BILATERAL TUBAL LIGATION EDC: 06-07-23  ALLERG: NKDA  PREVIOUS X 1 (Bilateral: Abdomen)     Patient location during evaluation: PACU Anesthesia Type: Spinal Level of consciousness: awake Pain management: pain level controlled Vital Signs Assessment: post-procedure vital signs reviewed and stable Respiratory status: spontaneous breathing, nonlabored ventilation and respiratory function stable Cardiovascular status: blood pressure returned to baseline and stable Postop Assessment: no apparent nausea or vomiting Anesthetic complications: no   No notable events documented.  Last Vitals:  Vitals:   05/29/23 1009 05/29/23 1109  BP: 130/68 132/78  Pulse: 88 84  Resp: 18 18  Temp: 37.1 C 36.9 C  SpO2: 98%     Last Pain:  Vitals:   05/29/23 1109  TempSrc: Oral  PainSc: 0-No pain                 Aeron Lheureux P Elenor Wildes

## 2023-05-29 NOTE — Progress Notes (Signed)
No change to H&P per patient history She has been having a lot of contractions-no bleeding, no ROM Reviewed procedure-repeat cesarean section and bilateral tubal ligation NKDA All questions answered. She states she understands and agrees

## 2023-05-30 LAB — CBC
HCT: 29.2 % — ABNORMAL LOW (ref 36.0–46.0)
Hemoglobin: 10.1 g/dL — ABNORMAL LOW (ref 12.0–15.0)
MCH: 30.3 pg (ref 26.0–34.0)
MCHC: 34.6 g/dL (ref 30.0–36.0)
MCV: 87.7 fL (ref 80.0–100.0)
Platelets: 300 10*3/uL (ref 150–400)
RBC: 3.33 MIL/uL — ABNORMAL LOW (ref 3.87–5.11)
RDW: 13.5 % (ref 11.5–15.5)
WBC: 11.2 10*3/uL — ABNORMAL HIGH (ref 4.0–10.5)
nRBC: 0 % (ref 0.0–0.2)

## 2023-05-30 LAB — BIRTH TISSUE RECOVERY COLLECTION (PLACENTA DONATION)

## 2023-05-30 NOTE — Progress Notes (Signed)
Postpartum Progress Note  Postpartum Day 1 s/p repeat Cesarean section.  Subjective:  Patient reports no overnight events.  She reports well controlled pain, ambulating without difficulty, voiding spontaneously, tolerating PO.  She reports Positive flatus, Negative BM.  Vaginal bleeding is appropriate.  Objective: Blood pressure (!) 112/54, pulse 75, temperature 97.6 F (36.4 C), temperature source Oral, resp. rate 15, height 5' 5.5" (1.664 m), weight 114.9 kg, SpO2 100%, unknown if currently breastfeeding.  Physical Exam:  General: alert and no distress Lochia: appropriate Abdomen: soft, ATTP Uterine Fundus: firm Incision: dressing in place DVT Evaluation: No evidence of DVT seen on physical exam.  Recent Labs    05/30/23 0426  HGB 10.1*  HCT 29.2*    Assessment/Plan: Postpartum Day 1, s/p C-section cHTN - well controlled on labetalol, continue 200 mg TID but will hold doses for hypotension Hypothyroid - continue Armour thyroid Baby boy - desires circ. Will perform if cleared by nursery.  Doing well, continue routine postpartum care. Anticipate discharge home tomorrow   LOS: 1 day   Misty [name] 05/30/2023, 9:30 AM

## 2023-05-30 NOTE — Progress Notes (Signed)
MOB was referred for history of depression/anxiety.  * Referral screened out by Clinical Social Worker because none of the following criteria appear to apply:  ~ History of anxiety/depression during this pregnancy, or of post-partum depression following prior delivery.  ~ Diagnosis of anxiety and/or depression within last 3 years  OR  * MOB's symptoms currently being treated with medication and/or therapy.  Per OB, MOB is prescribed Zoloft 100mg  for support.  Please contact the Clinical Social Worker if needs arise, by [facility] request, or if MOB scores greater than 9/yes to question 10 on Edinburgh Postpartum Depression Screen.  Enos Fling, Theresia Majors Clinical Social Worker [phone]

## 2023-05-31 MED ORDER — IBUPROFEN 600 MG PO TABS: 600.0000 mg | ORAL_TABLET | Freq: Four times a day (QID) | ORAL | 0 refills | Status: AC | PRN

## 2023-05-31 MED ORDER — ACETAMINOPHEN 325 MG PO TABS: 650.0000 mg | ORAL_TABLET | Freq: Four times a day (QID) | ORAL | 0 refills | Status: AC | PRN

## 2023-05-31 MED ORDER — OXYCODONE HCL 5 MG PO TABS: 5.0000 mg | ORAL_TABLET | ORAL | 0 refills | Status: AC | PRN

## 2023-05-31 NOTE — Progress Notes (Signed)
Postpartum Progress Note  Postpartum Day 2 s/p repeat Cesarean section.  Subjective:  Patient reports no overnight events.  She reports well controlled pain, ambulating without difficulty, voiding spontaneously, tolerating PO.  She reports Positive flatus, Negative BM.  Vaginal bleeding is appropriate.  Objective: Blood pressure (!) 150/69, pulse 72, temperature 98 F (36.7 C), resp. rate 17, height 5' 5.5" (1.664 m), weight 114.9 kg, SpO2 100%, unknown if currently breastfeeding.  Physical Exam:  General: alert and no distress Lochia: appropriate Abdomen: soft, ATTP Uterine Fundus: firm Incision: dressing in place DVT Evaluation: No evidence of DVT seen on physical exam.  Recent Labs    05/30/23 0426  HGB 10.1*  HCT 29.2*    Assessment/Plan: Postpartum Day 2, s/p C-section cHTN - Continue 200 mg TID Hypothyroid - continue Armour thyroid Baby boy - s/p circ Doing well, continue routine postpartum care. Anticipate discharge home today   LOS: 2 days   Misty [name] 05/31/2023, 8:54 AM

## 2023-05-31 NOTE — Progress Notes (Signed)
Pt stated Dr Lorane Gell saw her this morning and said to follow up in a week for BP check. RN encouraged to self-monitor at home. Pt stated she had headaches postpartum. RN reviewed dc including indications for preeclampsia and when to call MD

## 2023-05-31 NOTE — Discharge Summary (Signed)
Obstetric Discharge Summary  Misty [name] is a 32 y.o. female that presented on 05/29/2023 for repeat C section.  Pregnancy course is notable for chronic hypertension on labetalol and hypothyroidism on replacement.  She delivered a baby boy on 05/29/2023.  Her postpartum course was uncomplicated and on PPD#2, she reported well controlled pain, spontaneous voiding, ambulating without difficulty, and tolerating PO.  She was stable for discharge home on 05/31/2023 with plans for in-office follow up.  Hemoglobin  Date Value Ref Range Status  05/30/2023 10.1 (L) 12.0 - 15.0 g/dL Final   HCT  Date Value Ref Range Status  05/30/2023 29.2 (L) 36.0 - 46.0 % Final    Physical Exam:  General: alert and no distress Lochia: appropriate Uterine Fundus: firm Incision: healing well DVT Evaluation: No evidence of DVT seen on physical exam.  Discharge Diagnoses: term pregnancy delivered, cHTN, s/p C section  Discharge Information: Date: 05/31/2023 Activity: Pelvic rest, as tolerated Diet: routine Medications: Tylenol, motrin, oxycodone, labetalol Condition: stable Instructions: Refer to practice specific booklet.  Discussed prior to discharge.  Discharge to: Home  Follow-up Information     [facility] Follow up.   Why: Please follow up for a 1 week blood pressure and incision check. Contact information: [address] [place] [address] [phone]                 Newborn Data: Live born female  Birth Weight: 8 lb 15.2 oz (4060 g) APGAR: 4, 7  Newborn Delivery   Birth date/time: 05/29/2023 08:03:15 Delivery type: C-Section, Low Transverse Trial of labor: No C-section categorization: Repeat     Home with mother.  Lyn Henri 05/31/2023, 3:30 PM

## 2023-06-01 IMAGING — DX DG CHEST 2V
2 series · 2 of 2 positions shown · non-contrast
Comparison: None

CLINICAL DATA: Chest pain.  Cough over the last 6 months.

EXAM:
CHEST - 2 VIEW

[chest pa]
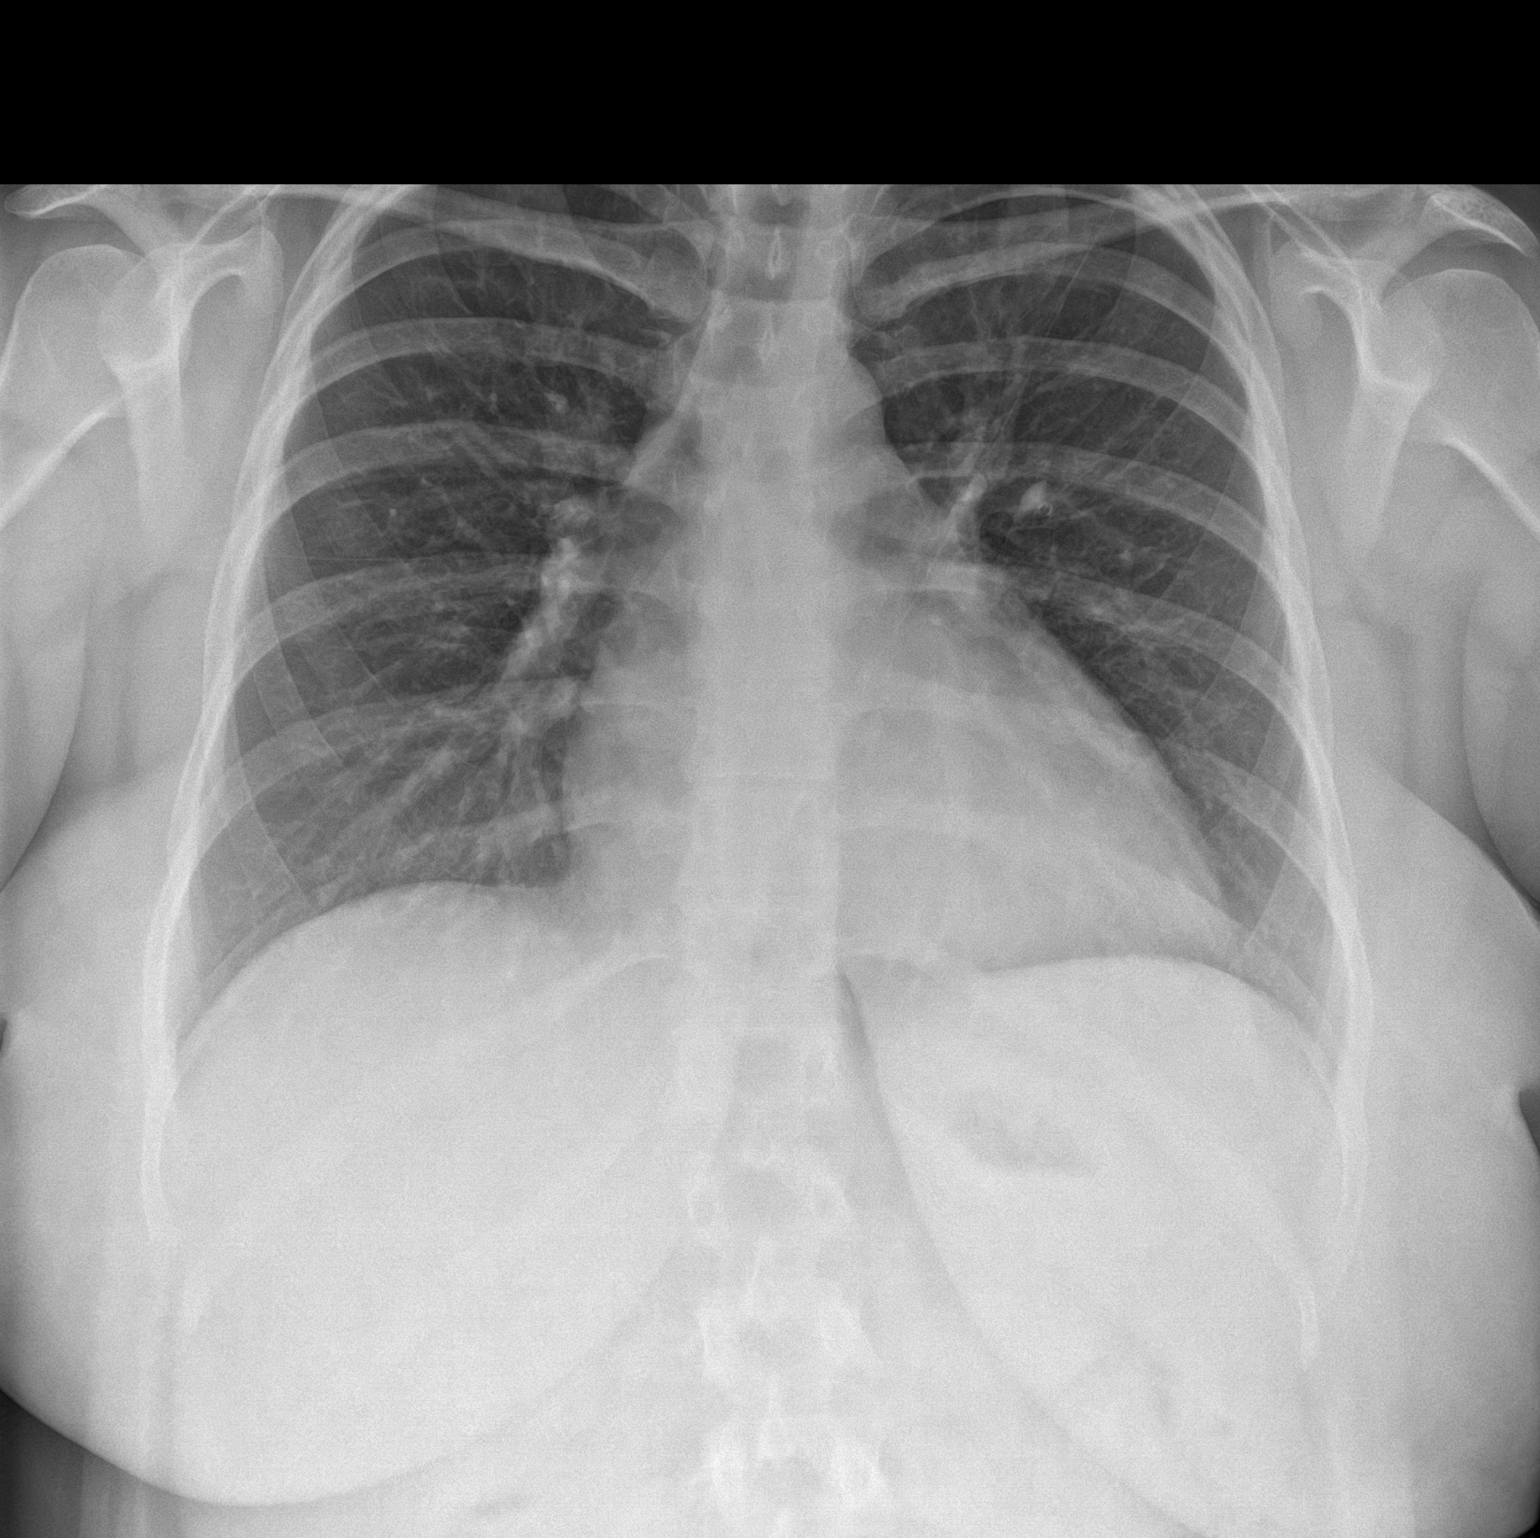

[chest lat]
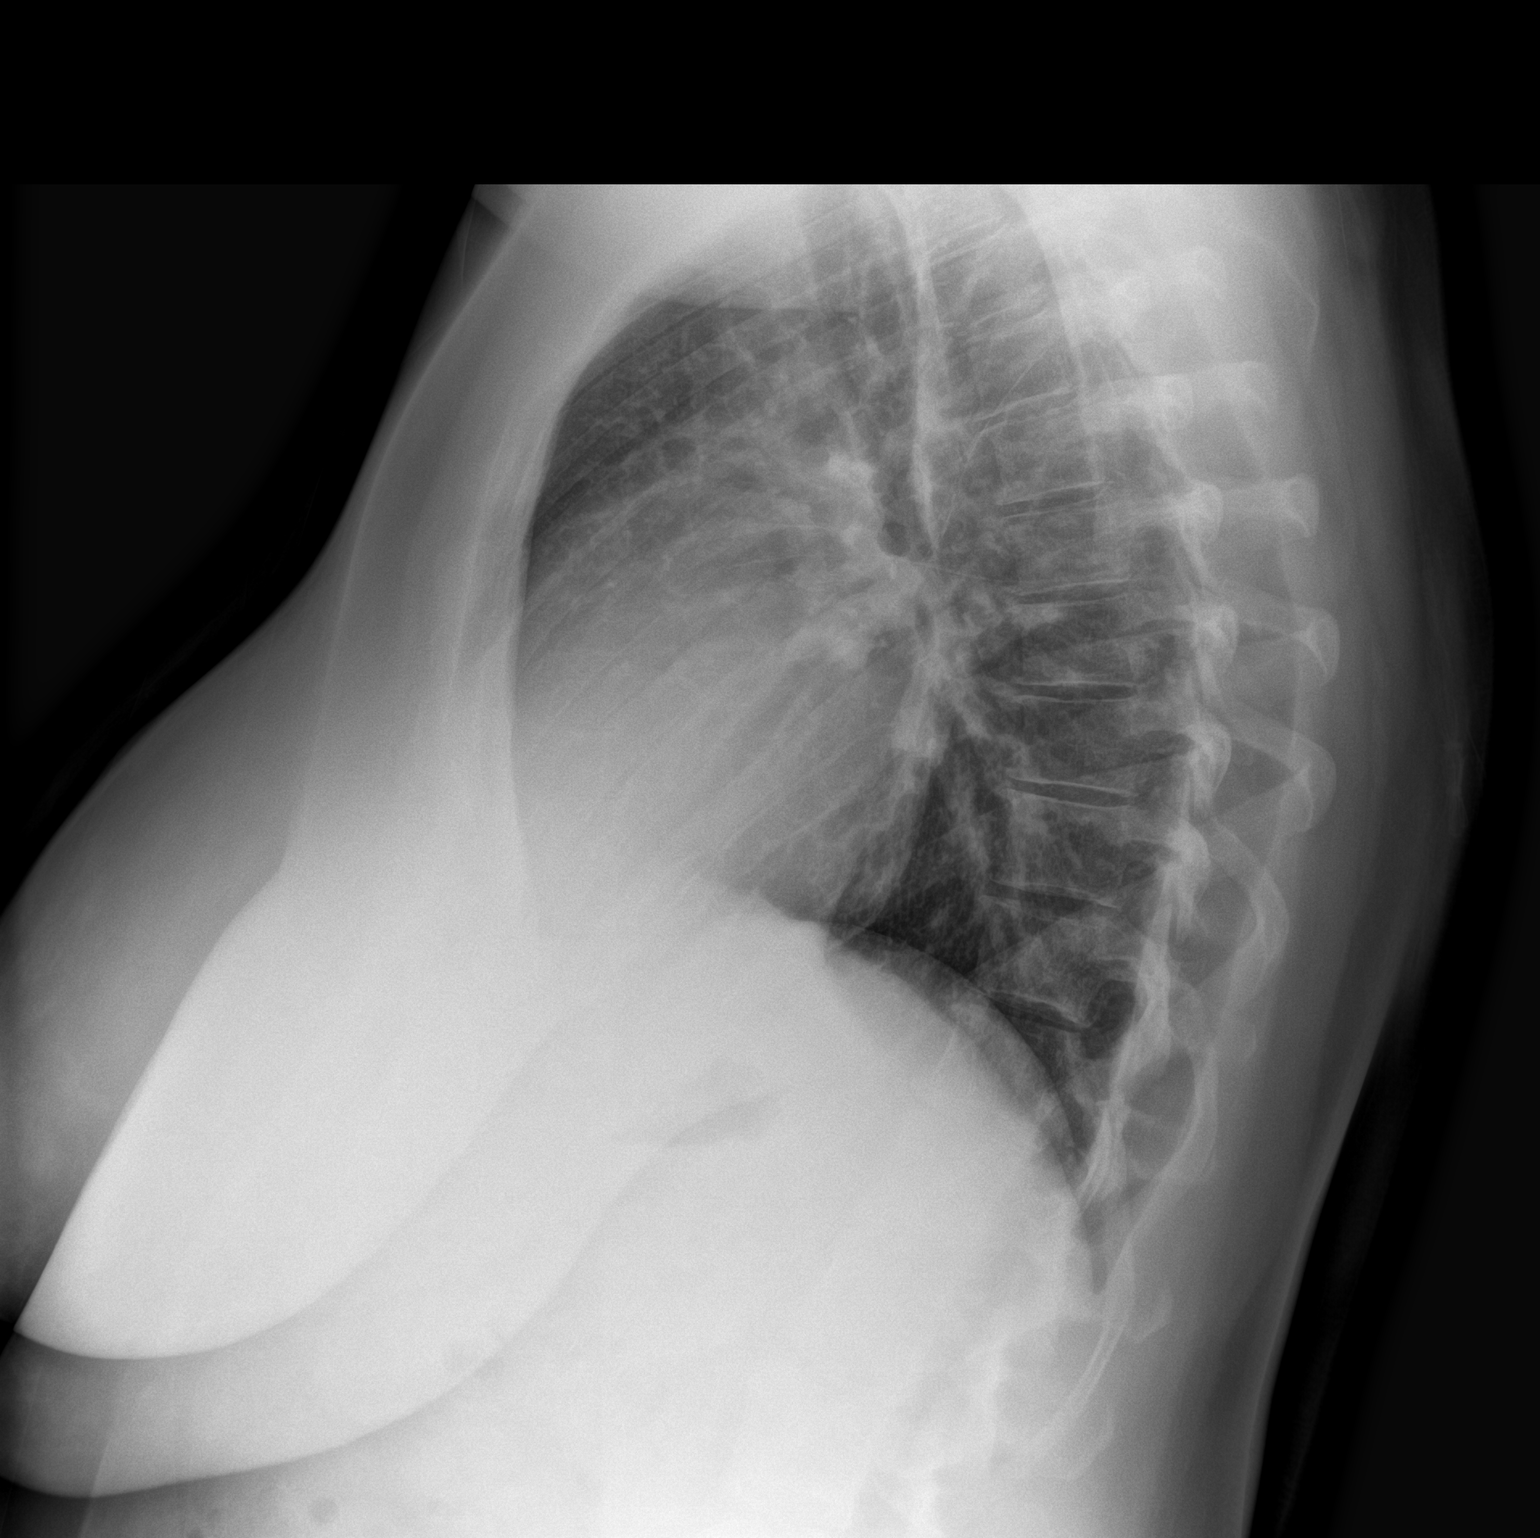

[2 of 2 positions shown; findings below may reference images not displayed]

FINDINGS: Heart size upper limits of normal. Mediastinal shadows are normal.
There is central bronchial thickening but no infiltrate, collapse or
effusion. No abnormal bone finding.
IMPRESSION: Possible bronchitis.  No consolidation or collapse.

## 2023-06-02 LAB — SURGICAL PATHOLOGY

## 2023-06-09 ENCOUNTER — Telehealth (HOSPITAL_COMMUNITY): Payer: Self-pay | Admitting: *Deleted

## 2023-06-09 NOTE — Telephone Encounter (Signed)
06/09/2023  Name: Misty [name] MRN: [id number] DOB: 03/28/1991  Reason for Call:  Transition of Care Hospital Discharge Call  Contact Status: Patient Contact Status: Message  Language assistant needed:          Follow-Up Questions:    [name] Postnatal Depression Scale:  In the Past 7 Days:    PHQ2-9 Depression Scale:     Discharge Follow-up:    Post-discharge interventions: NA  Salena Saner, RN 06/09/2023 11:42

## 2023-10-20 ENCOUNTER — Ambulatory Visit: Admitting: Podiatry

## 2023-10-20 ENCOUNTER — Ambulatory Visit (INDEPENDENT_AMBULATORY_CARE_PROVIDER_SITE_OTHER)

## 2023-10-20 ENCOUNTER — Encounter: Payer: Self-pay | Admitting: Podiatry

## 2023-10-20 DIAGNOSIS — M779 Enthesopathy, unspecified: Secondary | ICD-10-CM

## 2023-10-20 DIAGNOSIS — M722 Plantar fascial fibromatosis: Secondary | ICD-10-CM

## 2023-10-23 ENCOUNTER — Telehealth: Payer: Self-pay

## 2023-10-23 DIAGNOSIS — M722 Plantar fascial fibromatosis: Secondary | ICD-10-CM

## 2023-10-23 MED ORDER — BETAMETHASONE SOD PHOS & ACET 6 (3-3) MG/ML IJ SUSP
3.0000 mg | Freq: Once | INTRAMUSCULAR | Status: AC
  Administered 2023-10-23: 3 mg via INTRA_ARTICULAR

## 2023-10-23 MED ORDER — MELOXICAM 15 MG PO TABS: 15.0000 mg | ORAL_TABLET | Freq: Every day | ORAL | 1 refills | Status: DC

## 2023-10-23 NOTE — Telephone Encounter (Signed)
 Patient called and left a message - states that she was to supposed to get an Rx for anti inflammatory

## 2023-10-23 NOTE — Progress Notes (Signed)
   Chief Complaint  Patient presents with   Foot Pain    "My feet and ankles hurt all over." N - feet and ankle pain L - bilateral; right > left D - 10 - 15 years O - gradually worse C - tender, sharp pain A - when I get up in the morning, wear different shoes T - try to wear better shoes    HPI: 33 y.o. female presenting today as a new patient for evaluation of pain and tenderness associated to the bilateral feet and ankles.  This has been ongoing for several months.  Idiopathic gradual onset.    Past Medical History:  Diagnosis Date   Anemia    Asthma    COVID-19 07/14/2019   + 07/13/19   Hashimoto's disease 2021   Pregnancy induced hypertension     Past Surgical History:  Procedure Laterality Date   CESAREAN SECTION N/A 07/15/2019   Procedure: CESAREAN SECTION;  Surgeon: Thora Flint, MD;  Location: [facility] LD ORS;  Service: Obstetrics;  Laterality: N/A;   CESAREAN SECTION WITH BILATERAL TUBAL LIGATION Bilateral 05/29/2023   Procedure: REPEAT CESAREAN SECTION WITH BILATERAL TUBAL LIGATION EDC: 06-07-23  ALLERG: NKDA  PREVIOUS X 1;  Surgeon: Thora Flint, MD;  Location: [facility] LD ORS;  Service: Obstetrics;  Laterality: Bilateral;   NO PAST SURGERIES      No Known Allergies   Physical Exam: General: The patient is alert and oriented x3 in no acute distress.  Dermatology: Skin is warm, dry and supple bilateral lower extremities.   Vascular: Palpable pedal pulses bilaterally. Capillary refill within normal limits.  No appreciable edema.  No erythema.  Neurological: Grossly intact via light touch  Musculoskeletal Exam: Tenderness with palpation noted to the bilateral heels.  There is also some tightness with dorsiflexion of the ankle joint consistent with gastrocnemius equinus.  Generalized foot pain also noted bilateral.  Collapse of the medial longitudinal arch of the foot noted with weightbearing  Radiographic Exam B/L feet and ankles 10/20/2023:  Normal osseous  mineralization. Joint spaces preserved.  No fractures or osseous irregularities noted.  Accessory navicular noted to the right foot which clinically is asymptomatic  Assessment/Plan of Care: 1.  Plantar fasciitis bilateral 2.  Pes planovalgus bilateral 3.  Gastrocnemius equinus bilateral  -Patient evaluated.  X-rays reviewed -Stressed the importance of not going barefoot.  Recommend good supportive tennis shoes and sneakers.  Recommend arch supports to support the medial longitudinal arch of the foot -Recommend daily stretching exercises to alleviate the gastrocnemius equinus -Injection of 0.5 cc Celestone  Soluspan injected in bilateral plantar fascia -Prescription for meloxicam  15 mg daily -Return to clinic 4 weeks      Dot Gazella, DPM [facility]  Dr. Dot Gazella, DPM    [address] [place], [place] [address]                Office [phone]  Fax [phone]

## 2023-12-22 ENCOUNTER — Ambulatory Visit: Admitting: Podiatry

## 2024-02-27 ENCOUNTER — Other Ambulatory Visit: Payer: Self-pay | Admitting: Podiatry

## 2024-07-08 ENCOUNTER — Other Ambulatory Visit: Payer: Self-pay | Admitting: Podiatry
